# Patient Record
Sex: Female | Born: 1945 | Race: White | Hispanic: No | Smoking: Former smoker
Health system: Southern US, Community
[De-identification: ages and names within clinical notes are randomized; demographics above are authoritative.]

## PROBLEM LIST (undated history)

## (undated) DIAGNOSIS — I499 Cardiac arrhythmia, unspecified: Secondary | ICD-10-CM

## (undated) DIAGNOSIS — K219 Gastro-esophageal reflux disease without esophagitis: Secondary | ICD-10-CM

## (undated) DIAGNOSIS — M6530 Trigger finger, unspecified finger: Secondary | ICD-10-CM

## (undated) DIAGNOSIS — H409 Unspecified glaucoma: Secondary | ICD-10-CM

## (undated) DIAGNOSIS — M810 Age-related osteoporosis without current pathological fracture: Secondary | ICD-10-CM

## (undated) DIAGNOSIS — K2270 Barrett's esophagus without dysplasia: Secondary | ICD-10-CM

## (undated) DIAGNOSIS — K429 Umbilical hernia without obstruction or gangrene: Secondary | ICD-10-CM

## (undated) DIAGNOSIS — K08109 Complete loss of teeth, unspecified cause, unspecified class: Secondary | ICD-10-CM

## (undated) DIAGNOSIS — E785 Hyperlipidemia, unspecified: Secondary | ICD-10-CM

## (undated) DIAGNOSIS — G629 Polyneuropathy, unspecified: Secondary | ICD-10-CM

## (undated) DIAGNOSIS — Z8719 Personal history of other diseases of the digestive system: Secondary | ICD-10-CM

## (undated) DIAGNOSIS — Z9889 Other specified postprocedural states: Secondary | ICD-10-CM

## (undated) DIAGNOSIS — R569 Unspecified convulsions: Secondary | ICD-10-CM

## (undated) DIAGNOSIS — R112 Nausea with vomiting, unspecified: Secondary | ICD-10-CM

## (undated) DIAGNOSIS — D1800 Hemangioma unspecified site: Secondary | ICD-10-CM

## (undated) DIAGNOSIS — G40909 Epilepsy, unspecified, not intractable, without status epilepticus: Secondary | ICD-10-CM

## (undated) DIAGNOSIS — K5790 Diverticulosis of intestine, part unspecified, without perforation or abscess without bleeding: Secondary | ICD-10-CM

## (undated) DIAGNOSIS — R002 Palpitations: Secondary | ICD-10-CM

## (undated) DIAGNOSIS — G43909 Migraine, unspecified, not intractable, without status migrainosus: Secondary | ICD-10-CM

## (undated) DIAGNOSIS — M1990 Unspecified osteoarthritis, unspecified site: Secondary | ICD-10-CM

## (undated) DIAGNOSIS — K760 Fatty (change of) liver, not elsewhere classified: Secondary | ICD-10-CM

## (undated) DIAGNOSIS — C801 Malignant (primary) neoplasm, unspecified: Secondary | ICD-10-CM

## (undated) DIAGNOSIS — I10 Essential (primary) hypertension: Secondary | ICD-10-CM

## (undated) HISTORY — PX: PILONIDAL CYST / SINUS EXCISION: SUR543

## (undated) HISTORY — PX: KNEE ARTHROSCOPY: SUR90

## (undated) HISTORY — PX: OTHER SURGICAL HISTORY: SHX169

## (undated) HISTORY — PX: CARPAL TUNNEL RELEASE: SHX101

## (undated) HISTORY — PX: DILATION AND CURETTAGE OF UTERUS: SHX78

## (undated) HISTORY — PX: HEMORRHOID SURGERY: SHX153

## (undated) HISTORY — PX: COLONOSCOPY: SHX174

## (undated) HISTORY — PX: TENDON REPAIR: SHX5111

## (undated) HISTORY — PX: TONSILLECTOMY: SUR1361

## (undated) HISTORY — PX: ABDOMINAL HYSTERECTOMY: SHX81

---

## 1985-04-23 HISTORY — PX: BREAST BIOPSY: SHX20

## 1985-04-23 HISTORY — PX: BREAST EXCISIONAL BIOPSY: SUR124

## 1990-04-23 DIAGNOSIS — C801 Malignant (primary) neoplasm, unspecified: Secondary | ICD-10-CM

## 1990-04-23 HISTORY — DX: Malignant (primary) neoplasm, unspecified: C80.1

## 2004-02-22 ENCOUNTER — Ambulatory Visit: Payer: Self-pay | Admitting: Psychiatry

## 2004-12-14 ENCOUNTER — Ambulatory Visit: Payer: Self-pay | Admitting: Family Medicine

## 2005-04-20 ENCOUNTER — Other Ambulatory Visit: Payer: Self-pay

## 2005-04-20 ENCOUNTER — Emergency Department: Payer: Self-pay | Admitting: General Practice

## 2006-01-22 ENCOUNTER — Ambulatory Visit: Payer: Self-pay | Admitting: Family Medicine

## 2006-12-08 ENCOUNTER — Emergency Department: Payer: Self-pay | Admitting: Emergency Medicine

## 2007-02-24 ENCOUNTER — Ambulatory Visit: Payer: Self-pay | Admitting: Family Medicine

## 2008-02-08 ENCOUNTER — Inpatient Hospital Stay: Payer: Self-pay | Admitting: Internal Medicine

## 2008-04-07 ENCOUNTER — Ambulatory Visit: Payer: Self-pay | Admitting: Family Medicine

## 2009-04-12 ENCOUNTER — Ambulatory Visit: Payer: Self-pay | Admitting: Family Medicine

## 2009-09-09 ENCOUNTER — Ambulatory Visit: Payer: Self-pay | Admitting: Neurology

## 2010-04-13 ENCOUNTER — Ambulatory Visit: Payer: Self-pay | Admitting: Family Medicine

## 2011-04-19 ENCOUNTER — Ambulatory Visit: Payer: Self-pay | Admitting: Family Medicine

## 2012-04-29 ENCOUNTER — Ambulatory Visit: Payer: Self-pay | Admitting: Family Medicine

## 2012-05-09 ENCOUNTER — Encounter: Payer: Self-pay | Admitting: Rheumatology

## 2012-05-24 ENCOUNTER — Encounter: Payer: Self-pay | Admitting: Rheumatology

## 2013-02-27 ENCOUNTER — Emergency Department: Payer: Self-pay | Admitting: Emergency Medicine

## 2013-02-27 LAB — CBC
HGB: 14.7 g/dL (ref 12.0–16.0)
MCH: 31.7 pg (ref 26.0–34.0)
Platelet: 249 10*3/uL (ref 150–440)
RBC: 4.63 10*6/uL (ref 3.80–5.20)
RDW: 12.8 % (ref 11.5–14.5)

## 2013-02-27 LAB — HEPATIC FUNCTION PANEL A (ARMC)
Bilirubin, Direct: <0.1 mg/dL (ref 0.00–0.20)
Bilirubin,Total: 0.3 mg/dL (ref 0.2–1.0)
SGOT(AST): 28 U/L (ref 15–37)
SGPT (ALT): 21 U/L (ref 12–78)
Total Protein: 6.8 g/dL (ref 6.4–8.2)

## 2013-02-27 LAB — LIPASE, BLOOD: Lipase: 180 U/L (ref 73–393)

## 2013-02-27 LAB — BASIC METABOLIC PANEL
Anion Gap: 2 — ABNORMAL LOW (ref 7–16)
BUN: 12 mg/dL (ref 7–18)
Calcium, Total: 9.5 mg/dL (ref 8.5–10.1)
Chloride: 107 mmol/L (ref 98–107)
EGFR (African American): >60
Osmolality: 280 (ref 275–301)
Sodium: 140 mmol/L (ref 136–145)

## 2013-02-27 LAB — TROPONIN I: Troponin-I: <0.02 ng/mL

## 2013-05-05 ENCOUNTER — Ambulatory Visit: Payer: Self-pay | Admitting: Family Medicine

## 2013-05-26 ENCOUNTER — Ambulatory Visit: Payer: Self-pay | Admitting: Gastroenterology

## 2014-05-06 ENCOUNTER — Ambulatory Visit: Payer: Self-pay | Admitting: Family Medicine

## 2014-05-06 DIAGNOSIS — Z1231 Encounter for screening mammogram for malignant neoplasm of breast: Secondary | ICD-10-CM | POA: Diagnosis not present

## 2014-05-19 DIAGNOSIS — M25562 Pain in left knee: Secondary | ICD-10-CM | POA: Diagnosis not present

## 2014-05-19 DIAGNOSIS — Z0000 Encounter for general adult medical examination without abnormal findings: Secondary | ICD-10-CM | POA: Diagnosis not present

## 2014-05-19 DIAGNOSIS — Z8739 Personal history of other diseases of the musculoskeletal system and connective tissue: Secondary | ICD-10-CM | POA: Diagnosis not present

## 2014-06-08 DIAGNOSIS — M25511 Pain in right shoulder: Secondary | ICD-10-CM | POA: Diagnosis not present

## 2014-06-08 DIAGNOSIS — M1712 Unilateral primary osteoarthritis, left knee: Secondary | ICD-10-CM | POA: Diagnosis not present

## 2014-06-17 DIAGNOSIS — H2513 Age-related nuclear cataract, bilateral: Secondary | ICD-10-CM | POA: Diagnosis not present

## 2014-06-21 ENCOUNTER — Ambulatory Visit: Payer: Self-pay | Admitting: Unknown Physician Specialty

## 2014-06-21 DIAGNOSIS — M19011 Primary osteoarthritis, right shoulder: Secondary | ICD-10-CM | POA: Diagnosis not present

## 2014-06-21 DIAGNOSIS — M7581 Other shoulder lesions, right shoulder: Secondary | ICD-10-CM | POA: Diagnosis not present

## 2014-06-21 DIAGNOSIS — S838X2A Sprain of other specified parts of left knee, initial encounter: Secondary | ICD-10-CM | POA: Diagnosis not present

## 2014-06-21 DIAGNOSIS — S83242A Other tear of medial meniscus, current injury, left knee, initial encounter: Secondary | ICD-10-CM | POA: Diagnosis not present

## 2014-06-21 DIAGNOSIS — M1712 Unilateral primary osteoarthritis, left knee: Secondary | ICD-10-CM | POA: Diagnosis not present

## 2014-07-06 DIAGNOSIS — M1711 Unilateral primary osteoarthritis, right knee: Secondary | ICD-10-CM | POA: Insufficient documentation

## 2014-07-06 DIAGNOSIS — M7541 Impingement syndrome of right shoulder: Secondary | ICD-10-CM | POA: Diagnosis not present

## 2014-07-06 DIAGNOSIS — S83242A Other tear of medial meniscus, current injury, left knee, initial encounter: Secondary | ICD-10-CM | POA: Diagnosis not present

## 2014-07-06 DIAGNOSIS — M1712 Unilateral primary osteoarthritis, left knee: Secondary | ICD-10-CM | POA: Diagnosis not present

## 2014-07-19 DIAGNOSIS — K006 Disturbances in tooth eruption: Secondary | ICD-10-CM | POA: Diagnosis not present

## 2014-07-26 DIAGNOSIS — R69 Illness, unspecified: Secondary | ICD-10-CM | POA: Diagnosis not present

## 2014-08-03 DIAGNOSIS — S83209A Unspecified tear of unspecified meniscus, current injury, unspecified knee, initial encounter: Secondary | ICD-10-CM | POA: Insufficient documentation

## 2014-08-03 DIAGNOSIS — M7541 Impingement syndrome of right shoulder: Secondary | ICD-10-CM | POA: Diagnosis not present

## 2014-08-03 DIAGNOSIS — M1712 Unilateral primary osteoarthritis, left knee: Secondary | ICD-10-CM | POA: Diagnosis not present

## 2014-08-03 DIAGNOSIS — M23304 Other meniscus derangements, unspecified medial meniscus, left knee: Secondary | ICD-10-CM | POA: Diagnosis not present

## 2014-08-11 ENCOUNTER — Ambulatory Visit
Admit: 2014-08-11 | Disposition: A | Discharge status: Home / Self Care | Payer: Self-pay | Attending: Anesthesiology | Admitting: Anesthesiology

## 2014-08-11 DIAGNOSIS — I10 Essential (primary) hypertension: Secondary | ICD-10-CM | POA: Diagnosis not present

## 2014-08-11 DIAGNOSIS — I498 Other specified cardiac arrhythmias: Secondary | ICD-10-CM | POA: Diagnosis not present

## 2014-08-13 ENCOUNTER — Ambulatory Visit
Admit: 2014-08-13 | Disposition: A | Discharge status: Home / Self Care | Payer: Self-pay | Attending: Unknown Physician Specialty | Admitting: Unknown Physician Specialty

## 2014-08-13 DIAGNOSIS — S83242A Other tear of medial meniscus, current injury, left knee, initial encounter: Secondary | ICD-10-CM | POA: Diagnosis not present

## 2014-08-13 DIAGNOSIS — I10 Essential (primary) hypertension: Secondary | ICD-10-CM | POA: Diagnosis not present

## 2014-08-13 DIAGNOSIS — R05 Cough: Secondary | ICD-10-CM | POA: Diagnosis not present

## 2014-08-13 DIAGNOSIS — M2241 Chondromalacia patellae, right knee: Secondary | ICD-10-CM | POA: Diagnosis not present

## 2014-08-13 DIAGNOSIS — Z87891 Personal history of nicotine dependence: Secondary | ICD-10-CM | POA: Diagnosis not present

## 2014-08-13 DIAGNOSIS — M19011 Primary osteoarthritis, right shoulder: Secondary | ICD-10-CM | POA: Diagnosis not present

## 2014-08-13 DIAGNOSIS — G43909 Migraine, unspecified, not intractable, without status migrainosus: Secondary | ICD-10-CM | POA: Diagnosis not present

## 2014-08-13 DIAGNOSIS — M19041 Primary osteoarthritis, right hand: Secondary | ICD-10-CM | POA: Diagnosis not present

## 2014-08-13 DIAGNOSIS — M19042 Primary osteoarthritis, left hand: Secondary | ICD-10-CM | POA: Diagnosis not present

## 2014-08-13 DIAGNOSIS — M19012 Primary osteoarthritis, left shoulder: Secondary | ICD-10-CM | POA: Diagnosis not present

## 2014-08-13 DIAGNOSIS — M23222 Derangement of posterior horn of medial meniscus due to old tear or injury, left knee: Secondary | ICD-10-CM | POA: Diagnosis not present

## 2014-09-01 DIAGNOSIS — M7541 Impingement syndrome of right shoulder: Secondary | ICD-10-CM | POA: Diagnosis not present

## 2014-09-02 DIAGNOSIS — J40 Bronchitis, not specified as acute or chronic: Secondary | ICD-10-CM | POA: Diagnosis not present

## 2014-09-08 DIAGNOSIS — M7541 Impingement syndrome of right shoulder: Secondary | ICD-10-CM | POA: Diagnosis not present

## 2014-09-10 DIAGNOSIS — M7541 Impingement syndrome of right shoulder: Secondary | ICD-10-CM | POA: Diagnosis not present

## 2014-09-13 DIAGNOSIS — D2262 Melanocytic nevi of left upper limb, including shoulder: Secondary | ICD-10-CM | POA: Diagnosis not present

## 2014-09-13 DIAGNOSIS — D2271 Melanocytic nevi of right lower limb, including hip: Secondary | ICD-10-CM | POA: Diagnosis not present

## 2014-09-13 DIAGNOSIS — D225 Melanocytic nevi of trunk: Secondary | ICD-10-CM | POA: Diagnosis not present

## 2014-09-13 DIAGNOSIS — L408 Other psoriasis: Secondary | ICD-10-CM | POA: Diagnosis not present

## 2014-09-14 DIAGNOSIS — M7541 Impingement syndrome of right shoulder: Secondary | ICD-10-CM | POA: Diagnosis not present

## 2014-09-22 DIAGNOSIS — M7541 Impingement syndrome of right shoulder: Secondary | ICD-10-CM | POA: Diagnosis not present

## 2014-10-16 DIAGNOSIS — R69 Illness, unspecified: Secondary | ICD-10-CM | POA: Diagnosis not present

## 2014-11-15 DIAGNOSIS — J069 Acute upper respiratory infection, unspecified: Secondary | ICD-10-CM | POA: Diagnosis not present

## 2014-11-15 DIAGNOSIS — G609 Hereditary and idiopathic neuropathy, unspecified: Secondary | ICD-10-CM | POA: Diagnosis not present

## 2014-11-22 DIAGNOSIS — G609 Hereditary and idiopathic neuropathy, unspecified: Secondary | ICD-10-CM | POA: Diagnosis not present

## 2014-12-21 DIAGNOSIS — G43719 Chronic migraine without aura, intractable, without status migrainosus: Secondary | ICD-10-CM | POA: Diagnosis not present

## 2014-12-21 DIAGNOSIS — E559 Vitamin D deficiency, unspecified: Secondary | ICD-10-CM | POA: Diagnosis not present

## 2014-12-21 DIAGNOSIS — E538 Deficiency of other specified B group vitamins: Secondary | ICD-10-CM | POA: Diagnosis not present

## 2014-12-21 DIAGNOSIS — G609 Hereditary and idiopathic neuropathy, unspecified: Secondary | ICD-10-CM | POA: Diagnosis not present

## 2014-12-23 DIAGNOSIS — G609 Hereditary and idiopathic neuropathy, unspecified: Secondary | ICD-10-CM | POA: Diagnosis not present

## 2015-01-07 DIAGNOSIS — R05 Cough: Secondary | ICD-10-CM | POA: Diagnosis not present

## 2015-01-07 DIAGNOSIS — J208 Acute bronchitis due to other specified organisms: Secondary | ICD-10-CM | POA: Diagnosis not present

## 2015-01-07 DIAGNOSIS — B9689 Other specified bacterial agents as the cause of diseases classified elsewhere: Secondary | ICD-10-CM | POA: Diagnosis not present

## 2015-01-10 DIAGNOSIS — R05 Cough: Secondary | ICD-10-CM | POA: Diagnosis not present

## 2015-01-12 DIAGNOSIS — R05 Cough: Secondary | ICD-10-CM | POA: Diagnosis not present

## 2015-02-01 DIAGNOSIS — J309 Allergic rhinitis, unspecified: Secondary | ICD-10-CM | POA: Diagnosis not present

## 2015-02-01 DIAGNOSIS — A084 Viral intestinal infection, unspecified: Secondary | ICD-10-CM | POA: Diagnosis not present

## 2015-03-04 DIAGNOSIS — G43719 Chronic migraine without aura, intractable, without status migrainosus: Secondary | ICD-10-CM | POA: Diagnosis not present

## 2015-03-04 DIAGNOSIS — G609 Hereditary and idiopathic neuropathy, unspecified: Secondary | ICD-10-CM | POA: Diagnosis not present

## 2015-03-12 DIAGNOSIS — L739 Follicular disorder, unspecified: Secondary | ICD-10-CM | POA: Diagnosis not present

## 2015-03-12 DIAGNOSIS — L0291 Cutaneous abscess, unspecified: Secondary | ICD-10-CM | POA: Diagnosis not present

## 2015-04-22 DIAGNOSIS — B356 Tinea cruris: Secondary | ICD-10-CM | POA: Diagnosis not present

## 2015-04-22 DIAGNOSIS — L0291 Cutaneous abscess, unspecified: Secondary | ICD-10-CM | POA: Diagnosis not present

## 2015-05-23 DIAGNOSIS — E785 Hyperlipidemia, unspecified: Secondary | ICD-10-CM | POA: Diagnosis not present

## 2015-05-23 DIAGNOSIS — I10 Essential (primary) hypertension: Secondary | ICD-10-CM | POA: Diagnosis not present

## 2015-05-24 ENCOUNTER — Other Ambulatory Visit: Payer: Self-pay | Admitting: Family Medicine

## 2015-05-24 DIAGNOSIS — Z1231 Encounter for screening mammogram for malignant neoplasm of breast: Secondary | ICD-10-CM

## 2015-05-24 DIAGNOSIS — E785 Hyperlipidemia, unspecified: Secondary | ICD-10-CM | POA: Diagnosis not present

## 2015-05-24 DIAGNOSIS — Z7289 Other problems related to lifestyle: Secondary | ICD-10-CM | POA: Diagnosis not present

## 2015-05-24 DIAGNOSIS — Z1159 Encounter for screening for other viral diseases: Secondary | ICD-10-CM | POA: Diagnosis not present

## 2015-05-24 DIAGNOSIS — I10 Essential (primary) hypertension: Secondary | ICD-10-CM | POA: Diagnosis not present

## 2015-06-02 ENCOUNTER — Ambulatory Visit
Admission: RE | Admit: 2015-06-02 | Discharge: 2015-06-02 | Disposition: A | Discharge status: Home / Self Care | Payer: Commercial Managed Care - HMO | Source: Ambulatory Visit | Attending: Family Medicine | Admitting: Family Medicine

## 2015-06-02 ENCOUNTER — Other Ambulatory Visit: Payer: Self-pay | Admitting: Family Medicine

## 2015-06-02 DIAGNOSIS — Z1231 Encounter for screening mammogram for malignant neoplasm of breast: Secondary | ICD-10-CM

## 2015-06-02 HISTORY — DX: Malignant (primary) neoplasm, unspecified: C80.1

## 2015-08-10 DIAGNOSIS — H35372 Puckering of macula, left eye: Secondary | ICD-10-CM | POA: Diagnosis not present

## 2015-12-06 DIAGNOSIS — H8110 Benign paroxysmal vertigo, unspecified ear: Secondary | ICD-10-CM | POA: Diagnosis not present

## 2016-01-25 DIAGNOSIS — M19011 Primary osteoarthritis, right shoulder: Secondary | ICD-10-CM | POA: Diagnosis not present

## 2016-01-25 DIAGNOSIS — N764 Abscess of vulva: Secondary | ICD-10-CM | POA: Diagnosis not present

## 2016-01-25 DIAGNOSIS — M7551 Bursitis of right shoulder: Secondary | ICD-10-CM | POA: Diagnosis not present

## 2016-03-14 DIAGNOSIS — R220 Localized swelling, mass and lump, head: Secondary | ICD-10-CM | POA: Diagnosis not present

## 2016-04-10 DIAGNOSIS — B9561 Methicillin susceptible Staphylococcus aureus infection as the cause of diseases classified elsewhere: Secondary | ICD-10-CM | POA: Diagnosis not present

## 2016-04-10 DIAGNOSIS — L739 Follicular disorder, unspecified: Secondary | ICD-10-CM | POA: Diagnosis not present

## 2016-04-25 ENCOUNTER — Other Ambulatory Visit: Payer: Self-pay | Admitting: Family Medicine

## 2016-04-25 DIAGNOSIS — Z1231 Encounter for screening mammogram for malignant neoplasm of breast: Secondary | ICD-10-CM

## 2016-06-05 ENCOUNTER — Ambulatory Visit
Admission: RE | Admit: 2016-06-05 | Discharge: 2016-06-05 | Disposition: A | Discharge status: Home / Self Care | Payer: Commercial Managed Care - HMO | Source: Ambulatory Visit | Attending: Family Medicine | Admitting: Family Medicine

## 2016-06-05 DIAGNOSIS — Z1231 Encounter for screening mammogram for malignant neoplasm of breast: Secondary | ICD-10-CM | POA: Insufficient documentation

## 2016-06-14 DIAGNOSIS — E785 Hyperlipidemia, unspecified: Secondary | ICD-10-CM | POA: Diagnosis not present

## 2016-06-14 DIAGNOSIS — Z0000 Encounter for general adult medical examination without abnormal findings: Secondary | ICD-10-CM | POA: Diagnosis not present

## 2016-06-14 DIAGNOSIS — Z8639 Personal history of other endocrine, nutritional and metabolic disease: Secondary | ICD-10-CM | POA: Diagnosis not present

## 2016-06-21 DIAGNOSIS — L03311 Cellulitis of abdominal wall: Secondary | ICD-10-CM | POA: Diagnosis not present

## 2016-06-21 DIAGNOSIS — I10 Essential (primary) hypertension: Secondary | ICD-10-CM | POA: Diagnosis not present

## 2016-06-21 DIAGNOSIS — M25511 Pain in right shoulder: Secondary | ICD-10-CM | POA: Diagnosis not present

## 2016-06-21 DIAGNOSIS — Z0000 Encounter for general adult medical examination without abnormal findings: Secondary | ICD-10-CM | POA: Diagnosis not present

## 2016-06-27 DIAGNOSIS — L304 Erythema intertrigo: Secondary | ICD-10-CM | POA: Diagnosis not present

## 2016-06-27 DIAGNOSIS — L0390 Cellulitis, unspecified: Secondary | ICD-10-CM | POA: Diagnosis not present

## 2016-07-04 DIAGNOSIS — L0292 Furuncle, unspecified: Secondary | ICD-10-CM | POA: Diagnosis not present

## 2016-07-04 DIAGNOSIS — L304 Erythema intertrigo: Secondary | ICD-10-CM | POA: Diagnosis not present

## 2016-07-04 DIAGNOSIS — I781 Nevus, non-neoplastic: Secondary | ICD-10-CM | POA: Diagnosis not present

## 2016-07-04 DIAGNOSIS — L821 Other seborrheic keratosis: Secondary | ICD-10-CM | POA: Diagnosis not present

## 2016-08-15 ENCOUNTER — Other Ambulatory Visit: Payer: Self-pay | Admitting: Unknown Physician Specialty

## 2016-08-15 DIAGNOSIS — M25511 Pain in right shoulder: Principal | ICD-10-CM

## 2016-08-15 DIAGNOSIS — G8929 Other chronic pain: Secondary | ICD-10-CM

## 2016-08-29 ENCOUNTER — Ambulatory Visit
Admission: RE | Admit: 2016-08-29 | Discharge: 2016-08-29 | Disposition: A | Discharge status: Home / Self Care | Payer: Medicare HMO | Source: Ambulatory Visit | Attending: Unknown Physician Specialty | Admitting: Unknown Physician Specialty

## 2016-08-29 DIAGNOSIS — M25511 Pain in right shoulder: Secondary | ICD-10-CM | POA: Diagnosis not present

## 2016-08-29 DIAGNOSIS — M25711 Osteophyte, right shoulder: Secondary | ICD-10-CM | POA: Insufficient documentation

## 2016-08-29 DIAGNOSIS — M19011 Primary osteoarthritis, right shoulder: Secondary | ICD-10-CM | POA: Insufficient documentation

## 2016-08-29 DIAGNOSIS — G8929 Other chronic pain: Secondary | ICD-10-CM

## 2016-09-03 DIAGNOSIS — G8929 Other chronic pain: Secondary | ICD-10-CM | POA: Diagnosis not present

## 2016-09-03 DIAGNOSIS — M7541 Impingement syndrome of right shoulder: Secondary | ICD-10-CM | POA: Diagnosis not present

## 2016-09-03 DIAGNOSIS — M19011 Primary osteoarthritis, right shoulder: Secondary | ICD-10-CM | POA: Insufficient documentation

## 2016-09-03 DIAGNOSIS — M542 Cervicalgia: Secondary | ICD-10-CM | POA: Insufficient documentation

## 2016-09-03 DIAGNOSIS — M25511 Pain in right shoulder: Secondary | ICD-10-CM | POA: Diagnosis not present

## 2016-09-22 DIAGNOSIS — L03311 Cellulitis of abdominal wall: Secondary | ICD-10-CM | POA: Diagnosis not present

## 2017-01-07 DIAGNOSIS — L989 Disorder of the skin and subcutaneous tissue, unspecified: Secondary | ICD-10-CM | POA: Diagnosis not present

## 2017-01-07 DIAGNOSIS — M722 Plantar fascial fibromatosis: Secondary | ICD-10-CM | POA: Diagnosis not present

## 2017-01-09 DIAGNOSIS — L821 Other seborrheic keratosis: Secondary | ICD-10-CM | POA: Diagnosis not present

## 2017-01-09 DIAGNOSIS — R238 Other skin changes: Secondary | ICD-10-CM | POA: Diagnosis not present

## 2017-01-09 DIAGNOSIS — L820 Inflamed seborrheic keratosis: Secondary | ICD-10-CM | POA: Diagnosis not present

## 2017-01-21 DIAGNOSIS — M722 Plantar fascial fibromatosis: Secondary | ICD-10-CM | POA: Diagnosis not present

## 2017-01-21 DIAGNOSIS — M79672 Pain in left foot: Secondary | ICD-10-CM | POA: Diagnosis not present

## 2017-02-11 DIAGNOSIS — M722 Plantar fascial fibromatosis: Secondary | ICD-10-CM | POA: Diagnosis not present

## 2017-05-06 ENCOUNTER — Other Ambulatory Visit: Payer: Self-pay | Admitting: Family Medicine

## 2017-05-06 DIAGNOSIS — Z1231 Encounter for screening mammogram for malignant neoplasm of breast: Secondary | ICD-10-CM

## 2017-06-06 ENCOUNTER — Ambulatory Visit
Admission: RE | Admit: 2017-06-06 | Discharge: 2017-06-06 | Disposition: A | Discharge status: Home / Self Care | Payer: Commercial Managed Care - HMO | Source: Ambulatory Visit | Attending: Family Medicine | Admitting: Family Medicine

## 2017-06-06 DIAGNOSIS — Z1231 Encounter for screening mammogram for malignant neoplasm of breast: Secondary | ICD-10-CM | POA: Insufficient documentation

## 2017-06-26 DIAGNOSIS — I10 Essential (primary) hypertension: Secondary | ICD-10-CM | POA: Diagnosis not present

## 2017-07-01 DIAGNOSIS — R450 Nervousness: Secondary | ICD-10-CM | POA: Diagnosis not present

## 2017-07-01 DIAGNOSIS — I10 Essential (primary) hypertension: Secondary | ICD-10-CM | POA: Diagnosis not present

## 2017-07-01 DIAGNOSIS — Z0000 Encounter for general adult medical examination without abnormal findings: Secondary | ICD-10-CM | POA: Diagnosis not present

## 2017-07-01 DIAGNOSIS — Z23 Encounter for immunization: Secondary | ICD-10-CM | POA: Diagnosis not present

## 2017-07-01 DIAGNOSIS — R0789 Other chest pain: Secondary | ICD-10-CM | POA: Diagnosis not present

## 2017-07-10 DIAGNOSIS — E785 Hyperlipidemia, unspecified: Secondary | ICD-10-CM | POA: Diagnosis not present

## 2017-07-10 DIAGNOSIS — R002 Palpitations: Secondary | ICD-10-CM | POA: Insufficient documentation

## 2017-07-10 DIAGNOSIS — R0602 Shortness of breath: Secondary | ICD-10-CM | POA: Insufficient documentation

## 2017-07-10 DIAGNOSIS — R079 Chest pain, unspecified: Secondary | ICD-10-CM | POA: Insufficient documentation

## 2017-07-10 DIAGNOSIS — I10 Essential (primary) hypertension: Secondary | ICD-10-CM | POA: Diagnosis not present

## 2017-07-25 DIAGNOSIS — S60351A Superficial foreign body of right thumb, initial encounter: Secondary | ICD-10-CM | POA: Diagnosis not present

## 2017-07-25 DIAGNOSIS — Z1833 Retained wood fragments: Secondary | ICD-10-CM | POA: Diagnosis not present

## 2017-08-06 DIAGNOSIS — R079 Chest pain, unspecified: Secondary | ICD-10-CM | POA: Diagnosis not present

## 2017-08-16 DIAGNOSIS — E785 Hyperlipidemia, unspecified: Secondary | ICD-10-CM | POA: Diagnosis not present

## 2017-08-16 DIAGNOSIS — R0602 Shortness of breath: Secondary | ICD-10-CM | POA: Diagnosis not present

## 2017-08-16 DIAGNOSIS — R079 Chest pain, unspecified: Secondary | ICD-10-CM | POA: Diagnosis not present

## 2017-08-16 DIAGNOSIS — R002 Palpitations: Secondary | ICD-10-CM | POA: Diagnosis not present

## 2017-08-16 DIAGNOSIS — I10 Essential (primary) hypertension: Secondary | ICD-10-CM | POA: Diagnosis not present

## 2017-10-15 DIAGNOSIS — R202 Paresthesia of skin: Secondary | ICD-10-CM | POA: Diagnosis not present

## 2017-10-15 DIAGNOSIS — R413 Other amnesia: Secondary | ICD-10-CM | POA: Diagnosis not present

## 2017-12-19 DIAGNOSIS — E519 Thiamine deficiency, unspecified: Secondary | ICD-10-CM | POA: Diagnosis not present

## 2017-12-19 DIAGNOSIS — E538 Deficiency of other specified B group vitamins: Secondary | ICD-10-CM | POA: Diagnosis not present

## 2017-12-19 DIAGNOSIS — E559 Vitamin D deficiency, unspecified: Secondary | ICD-10-CM | POA: Diagnosis not present

## 2017-12-19 DIAGNOSIS — R4189 Other symptoms and signs involving cognitive functions and awareness: Secondary | ICD-10-CM | POA: Diagnosis not present

## 2017-12-19 DIAGNOSIS — G629 Polyneuropathy, unspecified: Secondary | ICD-10-CM | POA: Diagnosis not present

## 2017-12-19 DIAGNOSIS — G43019 Migraine without aura, intractable, without status migrainosus: Secondary | ICD-10-CM | POA: Diagnosis not present

## 2018-01-17 DIAGNOSIS — G8929 Other chronic pain: Secondary | ICD-10-CM | POA: Diagnosis not present

## 2018-01-17 DIAGNOSIS — M545 Low back pain: Secondary | ICD-10-CM | POA: Diagnosis not present

## 2018-01-17 DIAGNOSIS — M47816 Spondylosis without myelopathy or radiculopathy, lumbar region: Secondary | ICD-10-CM | POA: Diagnosis not present

## 2018-01-31 DIAGNOSIS — G8929 Other chronic pain: Secondary | ICD-10-CM | POA: Diagnosis not present

## 2018-01-31 DIAGNOSIS — M545 Low back pain: Secondary | ICD-10-CM | POA: Diagnosis not present

## 2018-02-05 DIAGNOSIS — M5416 Radiculopathy, lumbar region: Secondary | ICD-10-CM | POA: Diagnosis not present

## 2018-02-05 DIAGNOSIS — M5136 Other intervertebral disc degeneration, lumbar region: Secondary | ICD-10-CM | POA: Diagnosis not present

## 2018-02-05 DIAGNOSIS — M47816 Spondylosis without myelopathy or radiculopathy, lumbar region: Secondary | ICD-10-CM | POA: Diagnosis not present

## 2018-02-14 DIAGNOSIS — M6281 Muscle weakness (generalized): Secondary | ICD-10-CM | POA: Diagnosis not present

## 2018-02-14 DIAGNOSIS — M545 Low back pain: Secondary | ICD-10-CM | POA: Diagnosis not present

## 2018-02-14 DIAGNOSIS — R262 Difficulty in walking, not elsewhere classified: Secondary | ICD-10-CM | POA: Diagnosis not present

## 2018-02-17 DIAGNOSIS — M6281 Muscle weakness (generalized): Secondary | ICD-10-CM | POA: Diagnosis not present

## 2018-02-17 DIAGNOSIS — R262 Difficulty in walking, not elsewhere classified: Secondary | ICD-10-CM | POA: Diagnosis not present

## 2018-02-17 DIAGNOSIS — M545 Low back pain: Secondary | ICD-10-CM | POA: Diagnosis not present

## 2018-02-19 DIAGNOSIS — M545 Low back pain: Secondary | ICD-10-CM | POA: Diagnosis not present

## 2018-02-19 DIAGNOSIS — R262 Difficulty in walking, not elsewhere classified: Secondary | ICD-10-CM | POA: Diagnosis not present

## 2018-02-19 DIAGNOSIS — M6281 Muscle weakness (generalized): Secondary | ICD-10-CM | POA: Diagnosis not present

## 2018-02-25 DIAGNOSIS — M6281 Muscle weakness (generalized): Secondary | ICD-10-CM | POA: Diagnosis not present

## 2018-02-25 DIAGNOSIS — M545 Low back pain: Secondary | ICD-10-CM | POA: Diagnosis not present

## 2018-02-25 DIAGNOSIS — R262 Difficulty in walking, not elsewhere classified: Secondary | ICD-10-CM | POA: Diagnosis not present

## 2018-02-27 DIAGNOSIS — M545 Low back pain: Secondary | ICD-10-CM | POA: Diagnosis not present

## 2018-02-27 DIAGNOSIS — M6281 Muscle weakness (generalized): Secondary | ICD-10-CM | POA: Diagnosis not present

## 2018-02-27 DIAGNOSIS — R262 Difficulty in walking, not elsewhere classified: Secondary | ICD-10-CM | POA: Diagnosis not present

## 2018-03-05 DIAGNOSIS — M545 Low back pain: Secondary | ICD-10-CM | POA: Diagnosis not present

## 2018-03-05 DIAGNOSIS — R262 Difficulty in walking, not elsewhere classified: Secondary | ICD-10-CM | POA: Diagnosis not present

## 2018-03-05 DIAGNOSIS — M6281 Muscle weakness (generalized): Secondary | ICD-10-CM | POA: Diagnosis not present

## 2018-03-07 DIAGNOSIS — R262 Difficulty in walking, not elsewhere classified: Secondary | ICD-10-CM | POA: Diagnosis not present

## 2018-03-07 DIAGNOSIS — M545 Low back pain: Secondary | ICD-10-CM | POA: Diagnosis not present

## 2018-03-07 DIAGNOSIS — M6281 Muscle weakness (generalized): Secondary | ICD-10-CM | POA: Diagnosis not present

## 2018-03-17 DIAGNOSIS — M545 Low back pain: Secondary | ICD-10-CM | POA: Diagnosis not present

## 2018-03-17 DIAGNOSIS — R262 Difficulty in walking, not elsewhere classified: Secondary | ICD-10-CM | POA: Diagnosis not present

## 2018-03-17 DIAGNOSIS — H2511 Age-related nuclear cataract, right eye: Secondary | ICD-10-CM | POA: Diagnosis not present

## 2018-03-17 DIAGNOSIS — M6281 Muscle weakness (generalized): Secondary | ICD-10-CM | POA: Diagnosis not present

## 2018-03-19 DIAGNOSIS — R262 Difficulty in walking, not elsewhere classified: Secondary | ICD-10-CM | POA: Diagnosis not present

## 2018-03-19 DIAGNOSIS — M545 Low back pain: Secondary | ICD-10-CM | POA: Diagnosis not present

## 2018-03-19 DIAGNOSIS — M6281 Muscle weakness (generalized): Secondary | ICD-10-CM | POA: Diagnosis not present

## 2018-03-24 DIAGNOSIS — R262 Difficulty in walking, not elsewhere classified: Secondary | ICD-10-CM | POA: Diagnosis not present

## 2018-03-24 DIAGNOSIS — M6281 Muscle weakness (generalized): Secondary | ICD-10-CM | POA: Diagnosis not present

## 2018-03-24 DIAGNOSIS — M545 Low back pain: Secondary | ICD-10-CM | POA: Diagnosis not present

## 2018-03-26 DIAGNOSIS — M6281 Muscle weakness (generalized): Secondary | ICD-10-CM | POA: Diagnosis not present

## 2018-03-26 DIAGNOSIS — M545 Low back pain: Secondary | ICD-10-CM | POA: Diagnosis not present

## 2018-03-26 DIAGNOSIS — R262 Difficulty in walking, not elsewhere classified: Secondary | ICD-10-CM | POA: Diagnosis not present

## 2018-03-31 DIAGNOSIS — M6281 Muscle weakness (generalized): Secondary | ICD-10-CM | POA: Diagnosis not present

## 2018-03-31 DIAGNOSIS — M545 Low back pain: Secondary | ICD-10-CM | POA: Diagnosis not present

## 2018-03-31 DIAGNOSIS — R262 Difficulty in walking, not elsewhere classified: Secondary | ICD-10-CM | POA: Diagnosis not present

## 2018-04-02 DIAGNOSIS — M5136 Other intervertebral disc degeneration, lumbar region: Secondary | ICD-10-CM | POA: Diagnosis not present

## 2018-04-02 DIAGNOSIS — M5416 Radiculopathy, lumbar region: Secondary | ICD-10-CM | POA: Diagnosis not present

## 2018-04-02 DIAGNOSIS — M47816 Spondylosis without myelopathy or radiculopathy, lumbar region: Secondary | ICD-10-CM | POA: Diagnosis not present

## 2018-04-04 DIAGNOSIS — M545 Low back pain: Secondary | ICD-10-CM | POA: Diagnosis not present

## 2018-04-04 DIAGNOSIS — M6281 Muscle weakness (generalized): Secondary | ICD-10-CM | POA: Diagnosis not present

## 2018-04-04 DIAGNOSIS — R262 Difficulty in walking, not elsewhere classified: Secondary | ICD-10-CM | POA: Diagnosis not present

## 2018-04-08 DIAGNOSIS — M545 Low back pain: Secondary | ICD-10-CM | POA: Diagnosis not present

## 2018-04-08 DIAGNOSIS — R262 Difficulty in walking, not elsewhere classified: Secondary | ICD-10-CM | POA: Diagnosis not present

## 2018-04-08 DIAGNOSIS — M6281 Muscle weakness (generalized): Secondary | ICD-10-CM | POA: Diagnosis not present

## 2018-04-10 DIAGNOSIS — M6281 Muscle weakness (generalized): Secondary | ICD-10-CM | POA: Diagnosis not present

## 2018-04-10 DIAGNOSIS — R262 Difficulty in walking, not elsewhere classified: Secondary | ICD-10-CM | POA: Diagnosis not present

## 2018-04-10 DIAGNOSIS — M545 Low back pain: Secondary | ICD-10-CM | POA: Diagnosis not present

## 2018-04-15 DIAGNOSIS — R262 Difficulty in walking, not elsewhere classified: Secondary | ICD-10-CM | POA: Diagnosis not present

## 2018-04-15 DIAGNOSIS — M6281 Muscle weakness (generalized): Secondary | ICD-10-CM | POA: Diagnosis not present

## 2018-04-15 DIAGNOSIS — M545 Low back pain: Secondary | ICD-10-CM | POA: Diagnosis not present

## 2018-04-18 DIAGNOSIS — R262 Difficulty in walking, not elsewhere classified: Secondary | ICD-10-CM | POA: Diagnosis not present

## 2018-04-18 DIAGNOSIS — M6281 Muscle weakness (generalized): Secondary | ICD-10-CM | POA: Diagnosis not present

## 2018-04-18 DIAGNOSIS — M545 Low back pain: Secondary | ICD-10-CM | POA: Diagnosis not present

## 2018-04-19 DIAGNOSIS — J069 Acute upper respiratory infection, unspecified: Secondary | ICD-10-CM | POA: Diagnosis not present

## 2018-04-19 DIAGNOSIS — R05 Cough: Secondary | ICD-10-CM | POA: Diagnosis not present

## 2018-05-05 DIAGNOSIS — M25561 Pain in right knee: Secondary | ICD-10-CM | POA: Diagnosis not present

## 2018-05-05 DIAGNOSIS — M7581 Other shoulder lesions, right shoulder: Secondary | ICD-10-CM | POA: Diagnosis not present

## 2018-05-05 DIAGNOSIS — G8929 Other chronic pain: Secondary | ICD-10-CM | POA: Diagnosis not present

## 2018-05-05 DIAGNOSIS — M25511 Pain in right shoulder: Secondary | ICD-10-CM | POA: Diagnosis not present

## 2018-05-05 DIAGNOSIS — M25861 Other specified joint disorders, right knee: Secondary | ICD-10-CM | POA: Diagnosis not present

## 2018-05-05 DIAGNOSIS — M19011 Primary osteoarthritis, right shoulder: Secondary | ICD-10-CM | POA: Diagnosis not present

## 2018-05-06 ENCOUNTER — Other Ambulatory Visit: Payer: Self-pay | Admitting: Student

## 2018-05-06 DIAGNOSIS — M7581 Other shoulder lesions, right shoulder: Secondary | ICD-10-CM

## 2018-05-06 DIAGNOSIS — M19011 Primary osteoarthritis, right shoulder: Secondary | ICD-10-CM

## 2018-05-06 DIAGNOSIS — R262 Difficulty in walking, not elsewhere classified: Secondary | ICD-10-CM | POA: Diagnosis not present

## 2018-05-06 DIAGNOSIS — M545 Low back pain: Secondary | ICD-10-CM | POA: Diagnosis not present

## 2018-05-06 DIAGNOSIS — M6281 Muscle weakness (generalized): Secondary | ICD-10-CM | POA: Diagnosis not present

## 2018-05-08 DIAGNOSIS — R262 Difficulty in walking, not elsewhere classified: Secondary | ICD-10-CM | POA: Diagnosis not present

## 2018-05-08 DIAGNOSIS — M545 Low back pain: Secondary | ICD-10-CM | POA: Diagnosis not present

## 2018-05-08 DIAGNOSIS — M6281 Muscle weakness (generalized): Secondary | ICD-10-CM | POA: Diagnosis not present

## 2018-05-13 DIAGNOSIS — M545 Low back pain: Secondary | ICD-10-CM | POA: Diagnosis not present

## 2018-05-13 DIAGNOSIS — M6281 Muscle weakness (generalized): Secondary | ICD-10-CM | POA: Diagnosis not present

## 2018-05-13 DIAGNOSIS — R262 Difficulty in walking, not elsewhere classified: Secondary | ICD-10-CM | POA: Diagnosis not present

## 2018-05-15 DIAGNOSIS — R262 Difficulty in walking, not elsewhere classified: Secondary | ICD-10-CM | POA: Diagnosis not present

## 2018-05-15 DIAGNOSIS — M545 Low back pain: Secondary | ICD-10-CM | POA: Diagnosis not present

## 2018-05-15 DIAGNOSIS — M6281 Muscle weakness (generalized): Secondary | ICD-10-CM | POA: Diagnosis not present

## 2018-05-20 ENCOUNTER — Other Ambulatory Visit: Payer: Self-pay | Admitting: Family Medicine

## 2018-05-20 DIAGNOSIS — Z1231 Encounter for screening mammogram for malignant neoplasm of breast: Secondary | ICD-10-CM

## 2018-05-21 ENCOUNTER — Ambulatory Visit
Admission: RE | Admit: 2018-05-21 | Discharge: 2018-05-21 | Disposition: A | Discharge status: Home / Self Care | Payer: Medicare HMO | Source: Ambulatory Visit | Attending: Student | Admitting: Student

## 2018-05-21 DIAGNOSIS — M7581 Other shoulder lesions, right shoulder: Secondary | ICD-10-CM | POA: Diagnosis not present

## 2018-05-21 DIAGNOSIS — M19011 Primary osteoarthritis, right shoulder: Secondary | ICD-10-CM

## 2018-05-21 DIAGNOSIS — M25511 Pain in right shoulder: Secondary | ICD-10-CM | POA: Diagnosis not present

## 2018-06-02 DIAGNOSIS — M7581 Other shoulder lesions, right shoulder: Secondary | ICD-10-CM | POA: Diagnosis not present

## 2018-06-02 DIAGNOSIS — M7521 Bicipital tendinitis, right shoulder: Secondary | ICD-10-CM | POA: Diagnosis not present

## 2018-06-02 DIAGNOSIS — M1712 Unilateral primary osteoarthritis, left knee: Secondary | ICD-10-CM | POA: Diagnosis not present

## 2018-06-10 ENCOUNTER — Ambulatory Visit
Admission: RE | Admit: 2018-06-10 | Discharge: 2018-06-10 | Disposition: A | Discharge status: Home / Self Care | Payer: Medicare HMO | Source: Ambulatory Visit | Attending: Family Medicine | Admitting: Family Medicine

## 2018-06-10 DIAGNOSIS — Z1231 Encounter for screening mammogram for malignant neoplasm of breast: Secondary | ICD-10-CM | POA: Insufficient documentation

## 2018-06-17 ENCOUNTER — Encounter
Admission: RE | Admit: 2018-06-17 | Discharge: 2018-06-17 | Disposition: A | Discharge status: Home / Self Care | Payer: Medicare HMO | Source: Ambulatory Visit | Attending: Surgery | Admitting: Surgery

## 2018-06-17 ENCOUNTER — Other Ambulatory Visit: Payer: Self-pay

## 2018-06-17 DIAGNOSIS — Z01818 Encounter for other preprocedural examination: Secondary | ICD-10-CM | POA: Insufficient documentation

## 2018-06-17 DIAGNOSIS — E785 Hyperlipidemia, unspecified: Secondary | ICD-10-CM | POA: Insufficient documentation

## 2018-06-17 DIAGNOSIS — I10 Essential (primary) hypertension: Secondary | ICD-10-CM | POA: Diagnosis not present

## 2018-06-17 DIAGNOSIS — R21 Rash and other nonspecific skin eruption: Secondary | ICD-10-CM | POA: Diagnosis not present

## 2018-06-17 HISTORY — DX: Age-related osteoporosis without current pathological fracture: M81.0

## 2018-06-17 HISTORY — DX: Other specified postprocedural states: Z98.890

## 2018-06-17 HISTORY — DX: Unspecified convulsions: R56.9

## 2018-06-17 HISTORY — DX: Umbilical hernia without obstruction or gangrene: K42.9

## 2018-06-17 HISTORY — DX: Polyneuropathy, unspecified: G62.9

## 2018-06-17 HISTORY — DX: Hyperlipidemia, unspecified: E78.5

## 2018-06-17 HISTORY — DX: Epilepsy, unspecified, not intractable, without status epilepticus: G40.909

## 2018-06-17 HISTORY — DX: Other specified postprocedural states: R11.2

## 2018-06-17 HISTORY — DX: Essential (primary) hypertension: I10

## 2018-06-17 HISTORY — DX: Barrett's esophagus without dysplasia: K22.70

## 2018-06-17 HISTORY — DX: Unspecified glaucoma: H40.9

## 2018-06-17 HISTORY — DX: Unspecified osteoarthritis, unspecified site: M19.90

## 2018-06-17 HISTORY — DX: Migraine, unspecified, not intractable, without status migrainosus: G43.909

## 2018-06-17 HISTORY — DX: Gastro-esophageal reflux disease without esophagitis: K21.9

## 2018-06-17 NOTE — Pre-Procedure Instructions (Signed)
   ECG 12-lead3/02/2018 Branch Component Name Value Ref Range  Vent Rate (bpm) 79   PR Interval (msec) 142   QRS Interval (msec) 88   QT Interval (msec) 388   QTc (msec) 444   Other Result Information  This result has an attachment that is not available.  Result Narrative  Normal sinus rhythm Left axis deviation Septal infarct , age undetermined Abnormal ECG No previous ECGs available I reviewed and concur with this report. Electronically signed BS:[email or web address], MD, JAMES [phone]) on 07/21/2017 11:05:21 AM

## 2018-06-17 NOTE — Patient Instructions (Signed)
Your procedure is scheduled on: Thursday 06/26/2018 Report to [facility]. To find out your arrival time please call [phone] between 1PM - 3PM on Wednesday 06/25/2018.  Remember: Instructions that are not followed completely may result in serious medical risk, up to and including death, or upon the discretion of your surgeon and anesthesiologist your surgery may need to be rescheduled.     _X__ 1. Do not eat food after midnight the night before your procedure.                 No gum chewing or hard candies. You may drink clear liquids up to 2 hours                 before you are scheduled to arrive for your surgery- DO not drink clear                 liquids within 2 hours of the start of your surgery.                 Clear Liquids include:  water, apple juice without pulp, clear carbohydrate                 drink such as Clearfast or Gatorade, Black Coffee or Tea (Do not add                 anything to coffee or tea).  __X__2.  On the morning of surgery brush your teeth with toothpaste and water, you                 may rinse your mouth with mouthwash if you wish.  Do not swallow any              toothpaste of mouthwash.     _X__ 3.  No Alcohol for 24 hours before or after surgery.   _X__ 4.  Do Not Smoke or use e-cigarettes For 24 Hours Prior to Your Surgery.                 Do not use any chewable tobacco products for at least 6 hours prior to                 surgery.  ____  5.  Bring all medications with you on the day of surgery if instructed.   __X__  6.  Notify your doctor if there is any change in your medical condition      (cold, fever, infections).     Do not wear jewelry, make-up, hairpins, clips or nail polish. Do not wear lotions, powders, or perfumes.  Do not shave 48 hours prior to surgery. Men may shave face and neck. Do not bring valuables to the hospital.    [facility] is not responsible for any belongings or  valuables.  Contacts, dentures/partials or body piercings may not be worn into surgery. Bring a case for your contacts, glasses or hearing aids, a denture cup will be supplied. Leave your suitcase in the car. After surgery it may be brought to your room. For patients admitted to the hospital, discharge time is determined by your treatment team.   Patients discharged the day of surgery will not be allowed to drive home.   Please read over the following fact sheets that you were given:   MRSA Information  __X__ Take these medicines the morning of surgery with A SIP OF WATER:  1. metoprolol tartrate (LOPRESSOR)  2. ranitidine (ZANTAC)  3.   4.  5.  6.  ____ Fleet Enema (as directed)   __X__ Use CHG Soap/SAGE wipes as directed  ____ Use inhalers on the day of surgery  ____ Stop metformin/Janumet/Farxiga 2 days prior to surgery    ____ Take 1/2 of usual insulin dose the night before surgery. No insulin the morning          of surgery.   ____ Stop Blood Thinners Coumadin/Plavix/Xarelto/Pleta/Pradaxa/Eliquis/Effient/Aspirin  on   Or contact your Surgeon, Cardiologist or Medical Doctor regarding  ability to stop your blood thinners  __X__ Stop Anti-inflammatories 7 days before surgery such as Advil, Ibuprofen, Motrin,  BC or Goodies Powder, Naprosyn, Naproxen, Aleve, Aspirin    __X__ Stop all herbal supplements, fish oil or vitamin E until after surgery. STOP GINKO TODAY   ____ Bring C-Pap to the hospital.

## 2018-06-25 MED ORDER — CEFAZOLIN SODIUM-DEXTROSE 2-4 GM/100ML-% IV SOLN
2.0000 g | Freq: Once | INTRAVENOUS | Status: AC
  Administered 2018-06-26: 2 g via INTRAVENOUS

## 2018-06-26 ENCOUNTER — Encounter: Payer: Self-pay | Admitting: *Deleted

## 2018-06-26 ENCOUNTER — Ambulatory Visit: Payer: Medicare HMO | Admitting: Certified Registered"

## 2018-06-26 ENCOUNTER — Other Ambulatory Visit: Payer: Self-pay

## 2018-06-26 ENCOUNTER — Encounter
Admission: RE | Disposition: A | Discharge status: Home / Self Care | Payer: Self-pay | Source: Ambulatory Visit | Attending: Surgery

## 2018-06-26 ENCOUNTER — Ambulatory Visit
Admission: RE | Admit: 2018-06-26 | Discharge: 2018-06-26 | Disposition: A | Discharge status: Home / Self Care | Payer: Medicare HMO | Source: Ambulatory Visit | Attending: Surgery | Admitting: Surgery

## 2018-06-26 DIAGNOSIS — Z803 Family history of malignant neoplasm of breast: Secondary | ICD-10-CM | POA: Insufficient documentation

## 2018-06-26 DIAGNOSIS — Z8261 Family history of arthritis: Secondary | ICD-10-CM | POA: Diagnosis not present

## 2018-06-26 DIAGNOSIS — Z79899 Other long term (current) drug therapy: Secondary | ICD-10-CM | POA: Diagnosis not present

## 2018-06-26 DIAGNOSIS — I10 Essential (primary) hypertension: Secondary | ICD-10-CM | POA: Diagnosis not present

## 2018-06-26 DIAGNOSIS — Z7982 Long term (current) use of aspirin: Secondary | ICD-10-CM | POA: Diagnosis not present

## 2018-06-26 DIAGNOSIS — M19011 Primary osteoarthritis, right shoulder: Secondary | ICD-10-CM | POA: Diagnosis not present

## 2018-06-26 DIAGNOSIS — G40909 Epilepsy, unspecified, not intractable, without status epilepticus: Secondary | ICD-10-CM | POA: Insufficient documentation

## 2018-06-26 DIAGNOSIS — R51 Headache: Secondary | ICD-10-CM | POA: Diagnosis not present

## 2018-06-26 DIAGNOSIS — Z90710 Acquired absence of both cervix and uterus: Secondary | ICD-10-CM | POA: Insufficient documentation

## 2018-06-26 DIAGNOSIS — H409 Unspecified glaucoma: Secondary | ICD-10-CM | POA: Insufficient documentation

## 2018-06-26 DIAGNOSIS — M7521 Bicipital tendinitis, right shoulder: Secondary | ICD-10-CM | POA: Diagnosis not present

## 2018-06-26 DIAGNOSIS — Z87891 Personal history of nicotine dependence: Secondary | ICD-10-CM | POA: Insufficient documentation

## 2018-06-26 DIAGNOSIS — M7581 Other shoulder lesions, right shoulder: Secondary | ICD-10-CM | POA: Diagnosis not present

## 2018-06-26 DIAGNOSIS — M25511 Pain in right shoulder: Secondary | ICD-10-CM | POA: Diagnosis not present

## 2018-06-26 DIAGNOSIS — Z823 Family history of stroke: Secondary | ICD-10-CM | POA: Insufficient documentation

## 2018-06-26 DIAGNOSIS — E785 Hyperlipidemia, unspecified: Secondary | ICD-10-CM | POA: Insufficient documentation

## 2018-06-26 DIAGNOSIS — Z8262 Family history of osteoporosis: Secondary | ICD-10-CM | POA: Diagnosis not present

## 2018-06-26 DIAGNOSIS — M810 Age-related osteoporosis without current pathological fracture: Secondary | ICD-10-CM | POA: Diagnosis not present

## 2018-06-26 DIAGNOSIS — Z820 Family history of epilepsy and other diseases of the nervous system: Secondary | ICD-10-CM | POA: Insufficient documentation

## 2018-06-26 DIAGNOSIS — K219 Gastro-esophageal reflux disease without esophagitis: Secondary | ICD-10-CM | POA: Insufficient documentation

## 2018-06-26 DIAGNOSIS — M7541 Impingement syndrome of right shoulder: Secondary | ICD-10-CM | POA: Insufficient documentation

## 2018-06-26 DIAGNOSIS — G8918 Other acute postprocedural pain: Secondary | ICD-10-CM | POA: Diagnosis not present

## 2018-06-26 HISTORY — PX: SHOULDER ARTHROSCOPY WITH SUBACROMIAL DECOMPRESSION AND BICEP TENDON REPAIR: SHX5689

## 2018-06-26 SURGERY — SHOULDER ARTHROSCOPY WITH SUBACROMIAL DECOMPRESSION AND BICEP TENDON REPAIR
Anesthesia: General | Laterality: Right

## 2018-06-26 MED ORDER — FENTANYL CITRATE (PF) 100 MCG/2ML IJ SOLN
50.0000 ug | Freq: Once | INTRAMUSCULAR | Status: AC
  Administered 2018-06-26: 50 ug via INTRAVENOUS

## 2018-06-26 MED ORDER — GLYCOPYRROLATE 0.2 MG/ML IJ SOLN
INTRAMUSCULAR | Status: AC
  Filled 2018-06-26: qty 4

## 2018-06-26 MED ORDER — PROPOFOL 500 MG/50ML IV EMUL
INTRAVENOUS | Status: AC
  Filled 2018-06-26: qty 50

## 2018-06-26 MED ORDER — ROCURONIUM BROMIDE 50 MG/5ML IV SOLN
INTRAVENOUS | Status: AC
  Filled 2018-06-26: qty 4

## 2018-06-26 MED ORDER — ROCURONIUM BROMIDE 100 MG/10ML IV SOLN
INTRAVENOUS | Status: DC | PRN
  Administered 2018-06-26: 5 mg via INTRAVENOUS
  Administered 2018-06-26: 45 mg via INTRAVENOUS

## 2018-06-26 MED ORDER — LIDOCAINE HCL (PF) 2 % IJ SOLN
INTRAMUSCULAR | Status: AC
  Filled 2018-06-26: qty 30

## 2018-06-26 MED ORDER — FENTANYL CITRATE (PF) 100 MCG/2ML IJ SOLN: 25.0000 ug | INTRAMUSCULAR | Status: DC | PRN

## 2018-06-26 MED ORDER — METOCLOPRAMIDE HCL 10 MG PO TABS: 5.0000 mg | ORAL_TABLET | Freq: Three times a day (TID) | ORAL | Status: DC | PRN

## 2018-06-26 MED ORDER — LIDOCAINE HCL (PF) 1 % IJ SOLN
INTRAMUSCULAR | Status: AC
  Filled 2018-06-26: qty 5

## 2018-06-26 MED ORDER — SODIUM CHLORIDE 0.9 % IV SOLN
INTRAVENOUS | Status: DC | PRN
  Administered 2018-06-26: 15 ug/min via INTRAVENOUS

## 2018-06-26 MED ORDER — METOCLOPRAMIDE HCL 5 MG/ML IJ SOLN: 5.0000 mg | Freq: Three times a day (TID) | INTRAMUSCULAR | Status: DC | PRN

## 2018-06-26 MED ORDER — DEXAMETHASONE SODIUM PHOSPHATE 10 MG/ML IJ SOLN
INTRAMUSCULAR | Status: AC
  Filled 2018-06-26: qty 1

## 2018-06-26 MED ORDER — DEXMEDETOMIDINE HCL IN NACL 80 MCG/20ML IV SOLN
INTRAVENOUS | Status: AC
  Filled 2018-06-26: qty 20

## 2018-06-26 MED ORDER — ACETAMINOPHEN 10 MG/ML IV SOLN
INTRAVENOUS | Status: AC
  Filled 2018-06-26: qty 100

## 2018-06-26 MED ORDER — SUCCINYLCHOLINE CHLORIDE 20 MG/ML IJ SOLN
INTRAMUSCULAR | Status: DC | PRN
  Administered 2018-06-26: 100 mg via INTRAVENOUS

## 2018-06-26 MED ORDER — EPINEPHRINE PF 1 MG/ML IJ SOLN
INTRAMUSCULAR | Status: AC
  Filled 2018-06-26: qty 3

## 2018-06-26 MED ORDER — PHENYLEPHRINE HCL 10 MG/ML IJ SOLN
INTRAMUSCULAR | Status: DC | PRN
  Administered 2018-06-26: 100 ug via INTRAVENOUS

## 2018-06-26 MED ORDER — CEFAZOLIN SODIUM-DEXTROSE 2-4 GM/100ML-% IV SOLN
INTRAVENOUS | Status: AC
  Filled 2018-06-26: qty 100

## 2018-06-26 MED ORDER — BUPIVACAINE-EPINEPHRINE 0.5% -1:200000 IJ SOLN
INTRAMUSCULAR | Status: DC | PRN
  Administered 2018-06-26: 10 mL

## 2018-06-26 MED ORDER — PROPOFOL 10 MG/ML IV BOLUS
INTRAVENOUS | Status: DC | PRN
  Administered 2018-06-26: 150 mg via INTRAVENOUS

## 2018-06-26 MED ORDER — LACTATED RINGERS IV SOLN
INTRAVENOUS | Status: DC
  Administered 2018-06-26 (×2): via INTRAVENOUS

## 2018-06-26 MED ORDER — MIDAZOLAM HCL 2 MG/2ML IJ SOLN
INTRAMUSCULAR | Status: AC
  Administered 2018-06-26: 1 mg via INTRAVENOUS
  Filled 2018-06-26: qty 2

## 2018-06-26 MED ORDER — ACETAMINOPHEN 10 MG/ML IV SOLN
INTRAVENOUS | Status: DC | PRN
  Administered 2018-06-26: 1000 mg via INTRAVENOUS

## 2018-06-26 MED ORDER — TRAMADOL HCL 50 MG PO TABS: 50.0000 mg | ORAL_TABLET | Freq: Four times a day (QID) | ORAL | 0 refills | Status: DC | PRN

## 2018-06-26 MED ORDER — ONDANSETRON HCL 4 MG/2ML IJ SOLN: 4.0000 mg | Freq: Once | INTRAMUSCULAR | Status: DC | PRN

## 2018-06-26 MED ORDER — GLYCOPYRROLATE 0.2 MG/ML IJ SOLN
INTRAMUSCULAR | Status: DC | PRN
  Administered 2018-06-26: 0.2 mg via INTRAVENOUS

## 2018-06-26 MED ORDER — BUPIVACAINE LIPOSOME 1.3 % IJ SUSP
INTRAMUSCULAR | Status: AC
  Filled 2018-06-26: qty 20

## 2018-06-26 MED ORDER — SUGAMMADEX SODIUM 500 MG/5ML IV SOLN
INTRAVENOUS | Status: AC
  Filled 2018-06-26: qty 15

## 2018-06-26 MED ORDER — DEXAMETHASONE SODIUM PHOSPHATE 10 MG/ML IJ SOLN
INTRAMUSCULAR | Status: AC
  Filled 2018-06-26: qty 4

## 2018-06-26 MED ORDER — EPHEDRINE SULFATE 50 MG/ML IJ SOLN
INTRAMUSCULAR | Status: AC
  Filled 2018-06-26: qty 1

## 2018-06-26 MED ORDER — FENTANYL CITRATE (PF) 100 MCG/2ML IJ SOLN
INTRAMUSCULAR | Status: DC | PRN
  Administered 2018-06-26: 50 ug via INTRAVENOUS

## 2018-06-26 MED ORDER — ONDANSETRON HCL 4 MG/2ML IJ SOLN
INTRAMUSCULAR | Status: AC
  Filled 2018-06-26: qty 8

## 2018-06-26 MED ORDER — ONDANSETRON HCL 4 MG/2ML IJ SOLN: 4.0000 mg | Freq: Four times a day (QID) | INTRAMUSCULAR | Status: DC | PRN

## 2018-06-26 MED ORDER — ONDANSETRON HCL 4 MG PO TABS: 4.0000 mg | ORAL_TABLET | Freq: Four times a day (QID) | ORAL | Status: DC | PRN

## 2018-06-26 MED ORDER — POTASSIUM CHLORIDE IN NACL 20-0.9 MEQ/L-% IV SOLN: INTRAVENOUS | Status: DC

## 2018-06-26 MED ORDER — BUPIVACAINE HCL (PF) 0.5 % IJ SOLN
INTRAMUSCULAR | Status: AC
  Filled 2018-06-26: qty 30

## 2018-06-26 MED ORDER — LACTATED RINGERS IV SOLN
INTRAVENOUS | Status: DC | PRN
  Administered 2018-06-26: 3000 mL

## 2018-06-26 MED ORDER — FENTANYL CITRATE (PF) 100 MCG/2ML IJ SOLN
INTRAMUSCULAR | Status: AC
  Administered 2018-06-26: 50 ug via INTRAVENOUS
  Filled 2018-06-26: qty 2

## 2018-06-26 MED ORDER — LIDOCAINE HCL (CARDIAC) PF 100 MG/5ML IV SOSY
PREFILLED_SYRINGE | INTRAVENOUS | Status: DC | PRN
  Administered 2018-06-26: 100 mg via INTRAVENOUS

## 2018-06-26 MED ORDER — SUCCINYLCHOLINE CHLORIDE 20 MG/ML IJ SOLN
INTRAMUSCULAR | Status: AC
  Filled 2018-06-26: qty 4

## 2018-06-26 MED ORDER — BUPIVACAINE HCL (PF) 0.5 % IJ SOLN
INTRAMUSCULAR | Status: DC | PRN
  Administered 2018-06-26: 10 mL

## 2018-06-26 MED ORDER — ONDANSETRON HCL 4 MG/2ML IJ SOLN
INTRAMUSCULAR | Status: DC | PRN
  Administered 2018-06-26: 4 mg via INTRAVENOUS

## 2018-06-26 MED ORDER — BUPIVACAINE LIPOSOME 1.3 % IJ SUSP
INTRAMUSCULAR | Status: DC | PRN
  Administered 2018-06-26: 20 mL

## 2018-06-26 MED ORDER — DEXAMETHASONE SODIUM PHOSPHATE 10 MG/ML IJ SOLN
INTRAMUSCULAR | Status: DC | PRN
  Administered 2018-06-26: 10 mg via INTRAVENOUS

## 2018-06-26 MED ORDER — MIDAZOLAM HCL 2 MG/2ML IJ SOLN
1.0000 mg | Freq: Once | INTRAMUSCULAR | Status: AC
  Administered 2018-06-26: 1 mg via INTRAVENOUS

## 2018-06-26 MED ORDER — SUGAMMADEX SODIUM 500 MG/5ML IV SOLN
INTRAVENOUS | Status: DC | PRN
  Administered 2018-06-26: 160 mg via INTRAVENOUS

## 2018-06-26 MED ORDER — BUPIVACAINE HCL (PF) 0.5 % IJ SOLN
INTRAMUSCULAR | Status: AC
  Filled 2018-06-26: qty 10

## 2018-06-26 MED ORDER — FENTANYL CITRATE (PF) 100 MCG/2ML IJ SOLN
INTRAMUSCULAR | Status: AC
  Filled 2018-06-26: qty 2

## 2018-06-26 MED ORDER — EPHEDRINE SULFATE 50 MG/ML IJ SOLN
INTRAMUSCULAR | Status: AC
  Filled 2018-06-26: qty 3

## 2018-06-26 SURGICAL SUPPLY — 43 items
ANCHOR JUGGERKNOT WTAP NDL 2.9 (Anchor) ×3 IMPLANT
BIT DRILL JUGRKNT W/NDL BIT2.9 (DRILL) ×1 IMPLANT
BLADE FULL RADIUS 3.5 (BLADE) ×3 IMPLANT
BUR ACROMIONIZER 4.0 (BURR) ×3 IMPLANT
CANNULA SHAVER 8MMX76MM (CANNULA) ×3 IMPLANT
CHLORAPREP W/TINT 26ML (MISCELLANEOUS) ×3 IMPLANT
COVER MAYO STAND STRL (DRAPES) ×3 IMPLANT
COVER WAND RF STERILE (DRAPES) ×3 IMPLANT
DRAPE IMP U-DRAPE 54X76 (DRAPES) ×6 IMPLANT
DRILL JUGGERKNOT W/NDL BIT 2.9 (DRILL) ×3
ELECT REM PT RETURN 9FT ADLT (ELECTROSURGICAL) ×3
ELECTRODE REM PT RTRN 9FT ADLT (ELECTROSURGICAL) ×1 IMPLANT
GAUZE PETRO XEROFOAM 1X8 (MISCELLANEOUS) ×3 IMPLANT
GAUZE SPONGE 4X4 12PLY STRL (GAUZE/BANDAGES/DRESSINGS) ×3 IMPLANT
GLOVE BIO SURGEON STRL SZ7.5 (GLOVE) ×6 IMPLANT
GLOVE BIO SURGEON STRL SZ8 (GLOVE) ×6 IMPLANT
GLOVE BIOGEL PI IND STRL 8 (GLOVE) ×1 IMPLANT
GLOVE BIOGEL PI INDICATOR 8 (GLOVE) ×2
GLOVE INDICATOR 8.0 STRL GRN (GLOVE) ×3 IMPLANT
GOWN STRL REUS W/ TWL LRG LVL3 (GOWN DISPOSABLE) ×1 IMPLANT
GOWN STRL REUS W/ TWL XL LVL3 (GOWN DISPOSABLE) ×1 IMPLANT
GOWN STRL REUS W/TWL LRG LVL3 (GOWN DISPOSABLE) ×2
GOWN STRL REUS W/TWL XL LVL3 (GOWN DISPOSABLE) ×2
GRASPER SUT 15 45D LOW PRO (SUTURE) IMPLANT
IV LACTATED RINGER IRRG 3000ML (IV SOLUTION) ×4
IV LR IRRIG 3000ML ARTHROMATIC (IV SOLUTION) ×2 IMPLANT
MANIFOLD NEPTUNE II (INSTRUMENTS) ×3 IMPLANT
MASK FACE SPIDER DISP (MASK) ×3 IMPLANT
MAT ABSORB  FLUID 56X50 GRAY (MISCELLANEOUS) ×2
MAT ABSORB FLUID 56X50 GRAY (MISCELLANEOUS) ×1 IMPLANT
PACK ARTHROSCOPY SHOULDER (MISCELLANEOUS) ×3 IMPLANT
SLING ARM LRG DEEP (SOFTGOODS) ×3 IMPLANT
SLING ULTRA II LG (MISCELLANEOUS) ×3 IMPLANT
STAPLER SKIN PROX 35W (STAPLE) ×3 IMPLANT
STRAP SAFETY 5IN WIDE (MISCELLANEOUS) ×3 IMPLANT
SUT ETHIBOND 0 MO6 C/R (SUTURE) ×3 IMPLANT
SUT VIC AB 2-0 CT1 27 (SUTURE) ×4
SUT VIC AB 2-0 CT1 TAPERPNT 27 (SUTURE) ×2 IMPLANT
TAPE MICROFOAM 4IN (TAPE) ×3 IMPLANT
TUBING ARTHRO INFLOW-ONLY STRL (TUBING) ×3 IMPLANT
TUBING CONNECTING 10 (TUBING) ×2 IMPLANT
TUBING CONNECTING 10' (TUBING) ×1
WAND WEREWOLF FLOW 90D (MISCELLANEOUS) ×3 IMPLANT

## 2018-06-26 NOTE — Anesthesia Procedure Notes (Signed)
Anesthesia Regional Block: Interscalene brachial plexus block   Pre-Anesthetic Checklist: ,, timeout performed, Correct Patient, Correct Site, Correct Laterality, Correct Procedure, Correct Position, site marked, Risks and benefits discussed,  Surgical consent,  Pre-op evaluation,  At surgeon's request and post-op pain management  Laterality: Right  Prep: chloraprep, alcohol swabs       Needles:  Injection technique: Single-shot  Needle Type: Stimiplex     Needle Length: 5cm  Needle Gauge: 22     Additional Needles:   Procedures:, nerve stimulator,,, ultrasound used (permanent image in chart),,,,   Nerve Stimulator or Paresthesia:  Response: biceps flexion,   Additional Responses:   Narrative:  Start time: 06/26/2018 9:50 AM End time: 06/26/2018 10:00 AM Injection made incrementally with aspirations every 5 mL.  Performed by: Personally   Additional Notes: Time out called.  Patient placed in semi sitting position with a shoulder roll under the right shoulder.  The head was tilted to the left side.  A scout film was made and the landmarks were identified with a line drawn lateral to the probe.  The area was cleaned with alcohol and a skin wheal was made along the line with 1% Lidocaine plain.  The area was prepped and draped in sterile fashion with chloroprep.  The US probe was used in sterile fashion to reidentify the apprpriate landmarks.  The stimuplex needle was advanced to a lateral position from the nerve bundle and the nerve twitch was carried down to 0.5 mAmps before fade.  The solution of 20 cc of exparel and 10 cc of 0.5% Marcaine plain was incrementally injected with negative aspirations.  No pain on injection and easy injection.  The patient tolerated the procedure well.

## 2018-06-26 NOTE — Op Note (Signed)
06/26/2018  11:46 AM  Patient:   Danielle Armstrong  Pre-Op Diagnosis:   Impingement/tendinopathy with biceps tendinopathy, right shoulder.  Post-Op Diagnosis:   Impingement/tendinopathy with degenerative joint disease, labral fraying, and biceps tendinopathy, right shoulder.  Procedure:   Extensive arthroscopic debridement, arthroscopic subacromial decompression, and mini-open biceps tenodesis, right shoulder.  Anesthesia:   General endotracheal with interscalene block placed preoperatively by the anesthesiologist.  Surgeon:   Pascal Lux, MD  Assistant:   Morley Kos, PA-C; Orland Penman, PA-S  Findings:   As above. There were diffuse grade 3 chondromalacial changes involving both the glenoid and humerus, as well as moderate labral fraying anteriorly, superiorly, and posterior superiorly without frank detachment from the glenoid. The rotator cuff itself appeared to be in good condition when viewed from both the articular and bursal surfaces, other than some mild thickening involving the mid and posterior portions of the supraspinatus. The biceps tendon demonstrated moderate "lip sticking", but had no fraying or tears.  Complications:   None  Fluids:   900 cc  Estimated blood loss:   5 cc  Tourniquet time:   None  Drains:   None  Closure:   Staples      Brief clinical note:   The patient is a 73 year old female with a history of right shoulder pain. The patient's symptoms have progressed despite medications, activity modification, etc. The patient's history and examination are consistent with impingement/tendinopathy with a rotator cuff tear. These findings were confirmed by MRI scan. The patient presents at this time for definitive management of these shoulder symptoms.  Procedure:   The patient underwent placement of an interscalene block by the anesthesiologist in the preoperative holding area before being brought into the operating room and lain in the supine position. The patient  then underwent general endotracheal intubation and anesthesia before being repositioned in the beach chair position using the beach chair positioner. The right shoulder and upper extremity were prepped with ChloraPrep solution before being draped sterilely. Preoperative antibiotics were administered. A timeout was performed to confirm the proper surgical site before the expected portal sites and incision site were injected with 0.5% Sensorcaine with epinephrine. A posterior portal was created and the glenohumeral joint thoroughly inspected with the findings as described above. An anterior portal was created using an outside-in technique. The labrum and rotator cuff were further probed, again confirming the above-noted findings.  Areas of labral fraying were debrided back to stable margins using the full-radius resector, as were areas of synovitis and grade 3 chondromalacial changes on both the humerus and glenoid. The ArthroCare wand was inserted and used to release the biceps tendon from its labral anchor. It also was used to obtain hemostasis as well as to "anneal" the labrum superiorly and anteriorly. The instruments were removed from the joint after suctioning the excess fluid.  The camera was repositioned through the posterior portal into the subacromial space. A separate lateral portal was created using an outside-in technique. The 3.5 mm full-radius resector was introduced and used to perform a subtotal bursectomy. The ArthroCare wand was then inserted and used to remove the periosteal tissue off the undersurface of the anterior third of the acromion as well as to recess the coracoacromial ligament from its attachment along the anterior and lateral margins of the acromion. The 4.0 mm acromionizing bur was introduced and used to complete the decompression by removing the undersurface of the anterior third of the acromion. The full radius resector was reintroduced to remove any residual  bony debris before the  ArthroCare wand was reintroduced to obtain hemostasis. The instruments were then removed from the subacromial space after suctioning the excess fluid.  An approximately 4-5 cm incision was made over the anterolateral aspect of the shoulder beginning at the anterolateral corner of the acromion and extending distally in line with the bicipital groove. This incision was carried down through the subcutaneous tissues to expose the deltoid fascia. The raphae between the anterior and middle thirds was identified and this plane developed to provide access into the subacromial space. Additional bursal tissues were debrided sharply using Metzenbaum scissors. The rotator cuff tear was carefully inspected.  There was no evidence of any bursal surface tearing.  The bicipital groove was identified by palpation and opened for 1-1.5 cm. The biceps tendon stump was retrieved through this defect. The floor of the bicipital groove was roughened with a curet before a a single Biomet 2.9 mm JuggerKnot anchor was inserted. Both sets of sutures were passed through the biceps tendon and tied securely to effect the tenodesis. The bicipital sheath was reapproximated using two #0 Ethibond interrupted sutures, incorporating the biceps tendon to further reinforce the tenodesis.  The wound was copiously irrigated with sterile saline solution before the deltoid raphae was reapproximated using 2-0 Vicryl interrupted sutures. The subcutaneous tissues were closed in two layers using 2-0 Vicryl interrupted sutures before the skin was closed using staples. The portal sites also were closed using staples. A sterile bulky dressing was applied to the shoulder before the arm was placed into a shoulder immobilizer. The patient was then awakened, extubated, and returned to the recovery room in satisfactory condition after tolerating the procedure well.

## 2018-06-26 NOTE — Anesthesia Post-op Follow-up Note (Signed)
Anesthesia QCDR form completed.        

## 2018-06-26 NOTE — Transfer of Care (Signed)
Immediate Anesthesia Transfer of Care Note  Patient: Danielle Armstrong  Procedure(s) Performed: SHOULDER ARTHROSCOPY WITH DEBRIDEMENT, DECOMPRESSION, POSSIBLE ROTATOR CUFF REPAIR AND BICEP TENODESIS-RIGHT (Right )  Patient Location: PACU  Anesthesia Type:General  Level of Consciousness: awake, alert  and patient cooperative  Airway & Oxygen Therapy: Patient Spontanous Breathing and Patient connected to face mask oxygen  Post-op Assessment: Report given to RN and Post -op Vital signs reviewed and stable  Post vital signs: Reviewed and stable  Last Vitals:  Vitals Value Taken Time  BP    Temp    Pulse    Resp    SpO2      Last Pain:  Vitals:   06/26/18 1204  TempSrc:   PainSc: (P) Asleep         Complications: No apparent anesthesia complications

## 2018-06-26 NOTE — Discharge Instructions (Signed)
Interscalene Nerve Block with Exparel  1.  For your surgery you have received an Interscalene Nerve Block with Exparel. 2. Nerve Blocks affect many types of nerves, including nerves that control movement, pain and normal sensation.  You may experience feelings such as numbness, tingling, heaviness, weakness or the inability to move your arm or the feeling or sensation that your arm has "fallen asleep". 3. A nerve block with Exparel can last up to 5 days.  Usually the weakness wears off first.  The tingling and heaviness usually wear off next.  Finally you may start to notice pain.  Keep in mind that this may occur in any order.  Once a nerve block starts to wear off it is usually completely gone within 60 minutes. 4. ISNB may cause mild shortness of breath, a hoarse voice, blurry vision, unequal pupils, or drooping of the face on the same side as the nerve block.  These symptoms will usually resolve with the numbness.  Very rarely the procedure itself can cause mild seizures. 5. If needed, your surgeon will give you a prescription for pain medication.  It will take about 60 minutes for the oral pain medication to become fully effective.  So, it is recommended that you start taking this medication before the nerve block first begins to wear off, or when you first begin to feel discomfort. 6. Take your pain medication only as prescribed.  Pain medication can cause sedation and decrease your breathing if you take more than you need for the level of pain that you have. 7. Nausea is a common side effect of many pain medications.  You may want to eat something before taking your pain medicine to prevent nausea. 8. After an Interscalene nerve block, you cannot feel pain, pressure or extremes in temperature in the effected arm.  Because your arm is numb it is at an increased risk for injury.  To decrease the possibility of injury, please practice the following:  a. While you are awake change the position of  your arm frequently to prevent too much pressure on any one area for prolonged periods of time. b.  If you have a cast or tight dressing, check the color or your fingers every couple of hours.  Call your surgeon with the appearance of any discoloration (white or blue). c. If you are given a sling to wear before you go home, please wear it  at all times until the block has completely worn off.  Do not get up at night without your sling. d. Please contact [facility] or your surgeon if you do not begin to regain sensation after 7 days from the surgery.  Anesthesia may be contacted by calling the Same Day Surgery Department, Mon. through Fri., 6 am to 4 pm at [phone].   e. If you experience any other problems or concerns, please contact your surgeon's office. f. If you experience severe or prolonged shortness of breath go to the nearest emergency department.   Orthopedic discharge instructions: Keep dressing dry and intact.  May shower after dressing changed on post-op day #4 (Monday).  Cover staples with Band-Aids after drying off. Apply ice frequently to shoulder. Take ibuprofen 800 mg TID with meals for 7-10 days, then as necessary. Take Tramadol as prescribed when needed.  May supplement with ES Tylenol if necessary. Keep shoulder immobilizer on at all times except may remove for bathing purposes. Follow-up in 10-14 days or as scheduled.

## 2018-06-26 NOTE — Anesthesia Procedure Notes (Signed)
Procedure Name: Intubation Date/Time: 06/26/2018 10:34 AM Performed by: Kelton Pillar, CRNA Pre-anesthesia Checklist: Patient identified, Emergency Drugs available, Patient being monitored and Suction available Oxygen Delivery Method: Circle system utilized Preoxygenation: Pre-oxygenation with 100% oxygen Induction Type: IV induction Ventilation: Mask ventilation without difficulty Laryngoscope Size: Mac and 3 Grade View: Grade I Tube type: Oral Tube size: 6.5 mm Number of attempts: 1 Airway Equipment and Method: Stylet Placement Confirmation: ETT inserted through vocal cords under direct vision,  positive ETCO2,  CO2 detector and breath sounds checked- equal and bilateral Secured at: 22 cm Tube secured with: Tape Dental Injury: Teeth and Oropharynx as per pre-operative assessment

## 2018-06-26 NOTE — H&P (Signed)
Paper H&P to be scanned into permanent record. H&P reviewed and patient re-examined. No changes. 

## 2018-06-26 NOTE — Anesthesia Preprocedure Evaluation (Addendum)
Anesthesia Evaluation  Patient identified by MRN, date of birth, ID band Patient awake    Reviewed: Allergy & Precautions, NPO status , Patient's Chart, lab work & pertinent test results  History of Anesthesia Complications (+) PONV and history of anesthetic complications  Airway Mallampati: III       Dental  (+) Upper Dentures   Pulmonary former smoker,    Pulmonary exam normal        Cardiovascular hypertension, Normal cardiovascular exam     Neuro/Psych  Headaches, Seizures -, Well Controlled,  negative psych ROS   GI/Hepatic Neg liver ROS, GERD  ,  Endo/Other  negative endocrine ROS  Renal/GU negative Renal ROS  Female GU complaint     Musculoskeletal  (+) Arthritis , Osteoarthritis,    Abdominal Normal abdominal exam  (+)   Peds negative pediatric ROS (+)  Hematology negative hematology ROS (+)   Anesthesia Other Findings Past Medical History: No date: Barretts esophagus 1992: Cancer ([place])     Comment:  cervical No date: Epilepsy ([place])     Comment:  nonoe in years No date: GERD (gastroesophageal reflux disease) No date: Glaucoma No date: HLD (hyperlipidemia) No date: HTN (hypertension) No date: Migraines No date: Neuropathy No date: Osteoarthritis No date: Osteoporosis No date: PONV (postoperative nausea and vomiting)     Comment:  hospitalized x 1 wk unable to keep anything down orally               1992 No date: Seizures ([place])     Comment:  none in years No date: Umbilical hernia  Reproductive/Obstetrics                            Anesthesia Physical Anesthesia Plan  ASA: II  Anesthesia Plan: General   Post-op Pain Management:  Regional for Post-op pain   Induction: Intravenous  PONV Risk Score and Plan:   Airway Management Planned: Oral ETT  Additional Equipment:   Intra-op Plan:   Post-operative Plan: Extubation in OR  Informed Consent: I have  reviewed the patients History and Physical, chart, labs and discussed the procedure including the risks, benefits and alternatives for the proposed anesthesia with the patient or authorized representative who has indicated his/her understanding and acceptance.     Dental advisory given  Plan Discussed with: CRNA and Surgeon  Anesthesia Plan Comments: (Discussed interscalene block for post op pain control.  The risks were discussed which included nerve damage, infection, pneumothorax, difficulty breathing, seizures among others.  The patient understands risks and wishes to proceed with the procedure.)       Anesthesia Quick Evaluation

## 2018-06-27 NOTE — Anesthesia Postprocedure Evaluation (Signed)
Anesthesia Post Note  Patient: Danielle Armstrong  Procedure(s) Performed: SHOULDER ARTHROSCOPY WITH DEBRIDEMENT, DECOMPRESSION, POSSIBLE ROTATOR CUFF REPAIR AND BICEP TENODESIS-RIGHT (Right )  Patient location during evaluation: PACU Anesthesia Type: General Level of consciousness: awake and alert and oriented Pain management: pain level controlled Vital Signs Assessment: post-procedure vital signs reviewed and stable Respiratory status: spontaneous breathing Cardiovascular status: blood pressure returned to baseline Anesthetic complications: no     Last Vitals:  Vitals:   06/26/18 1333 06/26/18 1349  BP: (!) 138/58 131/65  Pulse: 70 83  Resp: 14 14  Temp:    SpO2: 97% 94%    Last Pain:  Vitals:   06/26/18 1349  TempSrc:   PainSc: 0-No pain                 Danielle Armstrong

## 2018-07-01 DIAGNOSIS — M25511 Pain in right shoulder: Secondary | ICD-10-CM | POA: Diagnosis not present

## 2018-07-01 DIAGNOSIS — M25611 Stiffness of right shoulder, not elsewhere classified: Secondary | ICD-10-CM | POA: Diagnosis not present

## 2018-07-01 DIAGNOSIS — M6281 Muscle weakness (generalized): Secondary | ICD-10-CM | POA: Diagnosis not present

## 2018-07-04 DIAGNOSIS — M25511 Pain in right shoulder: Secondary | ICD-10-CM | POA: Diagnosis not present

## 2018-07-04 DIAGNOSIS — M6281 Muscle weakness (generalized): Secondary | ICD-10-CM | POA: Diagnosis not present

## 2018-07-04 DIAGNOSIS — M25611 Stiffness of right shoulder, not elsewhere classified: Secondary | ICD-10-CM | POA: Diagnosis not present

## 2018-07-07 DIAGNOSIS — M25611 Stiffness of right shoulder, not elsewhere classified: Secondary | ICD-10-CM | POA: Diagnosis not present

## 2018-07-07 DIAGNOSIS — M25511 Pain in right shoulder: Secondary | ICD-10-CM | POA: Diagnosis not present

## 2018-07-08 DIAGNOSIS — K219 Gastro-esophageal reflux disease without esophagitis: Secondary | ICD-10-CM | POA: Diagnosis not present

## 2018-07-08 DIAGNOSIS — Z23 Encounter for immunization: Secondary | ICD-10-CM | POA: Diagnosis not present

## 2018-07-08 DIAGNOSIS — G43909 Migraine, unspecified, not intractable, without status migrainosus: Secondary | ICD-10-CM | POA: Diagnosis not present

## 2018-07-08 DIAGNOSIS — B3789 Other sites of candidiasis: Secondary | ICD-10-CM | POA: Diagnosis not present

## 2018-07-08 DIAGNOSIS — I10 Essential (primary) hypertension: Secondary | ICD-10-CM | POA: Diagnosis not present

## 2018-07-08 DIAGNOSIS — Z0000 Encounter for general adult medical examination without abnormal findings: Secondary | ICD-10-CM | POA: Diagnosis not present

## 2018-07-08 DIAGNOSIS — R413 Other amnesia: Secondary | ICD-10-CM | POA: Diagnosis not present

## 2018-07-10 DIAGNOSIS — M25511 Pain in right shoulder: Secondary | ICD-10-CM | POA: Diagnosis not present

## 2018-07-10 DIAGNOSIS — M25611 Stiffness of right shoulder, not elsewhere classified: Secondary | ICD-10-CM | POA: Diagnosis not present

## 2018-07-15 DIAGNOSIS — M25611 Stiffness of right shoulder, not elsewhere classified: Secondary | ICD-10-CM | POA: Diagnosis not present

## 2018-07-15 DIAGNOSIS — M25511 Pain in right shoulder: Secondary | ICD-10-CM | POA: Diagnosis not present

## 2018-07-15 DIAGNOSIS — M6281 Muscle weakness (generalized): Secondary | ICD-10-CM | POA: Diagnosis not present

## 2018-07-18 DIAGNOSIS — M25611 Stiffness of right shoulder, not elsewhere classified: Secondary | ICD-10-CM | POA: Diagnosis not present

## 2018-07-18 DIAGNOSIS — M25511 Pain in right shoulder: Secondary | ICD-10-CM | POA: Diagnosis not present

## 2018-07-22 DIAGNOSIS — M25611 Stiffness of right shoulder, not elsewhere classified: Secondary | ICD-10-CM | POA: Diagnosis not present

## 2018-07-22 DIAGNOSIS — M6281 Muscle weakness (generalized): Secondary | ICD-10-CM | POA: Diagnosis not present

## 2018-07-22 DIAGNOSIS — M25511 Pain in right shoulder: Secondary | ICD-10-CM | POA: Diagnosis not present

## 2018-07-24 DIAGNOSIS — M25611 Stiffness of right shoulder, not elsewhere classified: Secondary | ICD-10-CM | POA: Diagnosis not present

## 2018-07-24 DIAGNOSIS — M6281 Muscle weakness (generalized): Secondary | ICD-10-CM | POA: Diagnosis not present

## 2018-07-24 DIAGNOSIS — M25511 Pain in right shoulder: Secondary | ICD-10-CM | POA: Diagnosis not present

## 2018-07-29 DIAGNOSIS — M6281 Muscle weakness (generalized): Secondary | ICD-10-CM | POA: Diagnosis not present

## 2018-07-29 DIAGNOSIS — M25511 Pain in right shoulder: Secondary | ICD-10-CM | POA: Diagnosis not present

## 2018-07-29 DIAGNOSIS — M25611 Stiffness of right shoulder, not elsewhere classified: Secondary | ICD-10-CM | POA: Diagnosis not present

## 2018-07-31 DIAGNOSIS — M25511 Pain in right shoulder: Secondary | ICD-10-CM | POA: Diagnosis not present

## 2018-07-31 DIAGNOSIS — M25611 Stiffness of right shoulder, not elsewhere classified: Secondary | ICD-10-CM | POA: Diagnosis not present

## 2018-07-31 DIAGNOSIS — M6281 Muscle weakness (generalized): Secondary | ICD-10-CM | POA: Diagnosis not present

## 2018-08-05 DIAGNOSIS — M25511 Pain in right shoulder: Secondary | ICD-10-CM | POA: Diagnosis not present

## 2018-08-05 DIAGNOSIS — R262 Difficulty in walking, not elsewhere classified: Secondary | ICD-10-CM | POA: Diagnosis not present

## 2018-08-05 DIAGNOSIS — M6281 Muscle weakness (generalized): Secondary | ICD-10-CM | POA: Diagnosis not present

## 2018-08-05 DIAGNOSIS — M25611 Stiffness of right shoulder, not elsewhere classified: Secondary | ICD-10-CM | POA: Diagnosis not present

## 2018-08-07 DIAGNOSIS — M25611 Stiffness of right shoulder, not elsewhere classified: Secondary | ICD-10-CM | POA: Diagnosis not present

## 2018-08-07 DIAGNOSIS — M25511 Pain in right shoulder: Secondary | ICD-10-CM | POA: Diagnosis not present

## 2018-08-07 DIAGNOSIS — M6281 Muscle weakness (generalized): Secondary | ICD-10-CM | POA: Diagnosis not present

## 2018-08-07 DIAGNOSIS — R262 Difficulty in walking, not elsewhere classified: Secondary | ICD-10-CM | POA: Diagnosis not present

## 2018-08-11 DIAGNOSIS — M7521 Bicipital tendinitis, right shoulder: Secondary | ICD-10-CM | POA: Diagnosis not present

## 2018-08-12 DIAGNOSIS — M25611 Stiffness of right shoulder, not elsewhere classified: Secondary | ICD-10-CM | POA: Diagnosis not present

## 2018-08-12 DIAGNOSIS — M25511 Pain in right shoulder: Secondary | ICD-10-CM | POA: Diagnosis not present

## 2018-08-12 DIAGNOSIS — M6281 Muscle weakness (generalized): Secondary | ICD-10-CM | POA: Diagnosis not present

## 2018-08-14 DIAGNOSIS — M6281 Muscle weakness (generalized): Secondary | ICD-10-CM | POA: Diagnosis not present

## 2018-08-14 DIAGNOSIS — M25611 Stiffness of right shoulder, not elsewhere classified: Secondary | ICD-10-CM | POA: Diagnosis not present

## 2018-08-14 DIAGNOSIS — M25511 Pain in right shoulder: Secondary | ICD-10-CM | POA: Diagnosis not present

## 2018-08-18 DIAGNOSIS — M6281 Muscle weakness (generalized): Secondary | ICD-10-CM | POA: Diagnosis not present

## 2018-08-18 DIAGNOSIS — R262 Difficulty in walking, not elsewhere classified: Secondary | ICD-10-CM | POA: Diagnosis not present

## 2018-08-18 DIAGNOSIS — M25611 Stiffness of right shoulder, not elsewhere classified: Secondary | ICD-10-CM | POA: Diagnosis not present

## 2018-08-18 DIAGNOSIS — M25511 Pain in right shoulder: Secondary | ICD-10-CM | POA: Diagnosis not present

## 2018-08-20 DIAGNOSIS — M25611 Stiffness of right shoulder, not elsewhere classified: Secondary | ICD-10-CM | POA: Diagnosis not present

## 2018-08-20 DIAGNOSIS — M25511 Pain in right shoulder: Secondary | ICD-10-CM | POA: Diagnosis not present

## 2018-08-20 DIAGNOSIS — M6281 Muscle weakness (generalized): Secondary | ICD-10-CM | POA: Diagnosis not present

## 2018-08-25 DIAGNOSIS — M25611 Stiffness of right shoulder, not elsewhere classified: Secondary | ICD-10-CM | POA: Diagnosis not present

## 2018-08-25 DIAGNOSIS — M25511 Pain in right shoulder: Secondary | ICD-10-CM | POA: Diagnosis not present

## 2018-08-25 DIAGNOSIS — M6281 Muscle weakness (generalized): Secondary | ICD-10-CM | POA: Diagnosis not present

## 2018-08-28 DIAGNOSIS — M6281 Muscle weakness (generalized): Secondary | ICD-10-CM | POA: Diagnosis not present

## 2018-08-28 DIAGNOSIS — M25511 Pain in right shoulder: Secondary | ICD-10-CM | POA: Diagnosis not present

## 2018-08-28 DIAGNOSIS — M25611 Stiffness of right shoulder, not elsewhere classified: Secondary | ICD-10-CM | POA: Diagnosis not present

## 2018-09-01 DIAGNOSIS — M6281 Muscle weakness (generalized): Secondary | ICD-10-CM | POA: Diagnosis not present

## 2018-09-01 DIAGNOSIS — M25611 Stiffness of right shoulder, not elsewhere classified: Secondary | ICD-10-CM | POA: Diagnosis not present

## 2018-09-01 DIAGNOSIS — M25511 Pain in right shoulder: Secondary | ICD-10-CM | POA: Diagnosis not present

## 2018-09-04 DIAGNOSIS — M6281 Muscle weakness (generalized): Secondary | ICD-10-CM | POA: Diagnosis not present

## 2018-09-04 DIAGNOSIS — M25611 Stiffness of right shoulder, not elsewhere classified: Secondary | ICD-10-CM | POA: Diagnosis not present

## 2018-09-04 DIAGNOSIS — M25511 Pain in right shoulder: Secondary | ICD-10-CM | POA: Diagnosis not present

## 2018-09-09 DIAGNOSIS — M25511 Pain in right shoulder: Secondary | ICD-10-CM | POA: Diagnosis not present

## 2018-09-09 DIAGNOSIS — M6281 Muscle weakness (generalized): Secondary | ICD-10-CM | POA: Diagnosis not present

## 2018-09-09 DIAGNOSIS — M25611 Stiffness of right shoulder, not elsewhere classified: Secondary | ICD-10-CM | POA: Diagnosis not present

## 2018-09-11 DIAGNOSIS — M25611 Stiffness of right shoulder, not elsewhere classified: Secondary | ICD-10-CM | POA: Diagnosis not present

## 2018-09-11 DIAGNOSIS — M25511 Pain in right shoulder: Secondary | ICD-10-CM | POA: Diagnosis not present

## 2018-09-16 DIAGNOSIS — M25511 Pain in right shoulder: Secondary | ICD-10-CM | POA: Diagnosis not present

## 2018-09-16 DIAGNOSIS — M25611 Stiffness of right shoulder, not elsewhere classified: Secondary | ICD-10-CM | POA: Diagnosis not present

## 2018-09-16 DIAGNOSIS — M6281 Muscle weakness (generalized): Secondary | ICD-10-CM | POA: Diagnosis not present

## 2018-09-18 DIAGNOSIS — M6281 Muscle weakness (generalized): Secondary | ICD-10-CM | POA: Diagnosis not present

## 2018-09-18 DIAGNOSIS — M25611 Stiffness of right shoulder, not elsewhere classified: Secondary | ICD-10-CM | POA: Diagnosis not present

## 2018-09-18 DIAGNOSIS — M25511 Pain in right shoulder: Secondary | ICD-10-CM | POA: Diagnosis not present

## 2018-09-22 DIAGNOSIS — M65331 Trigger finger, right middle finger: Secondary | ICD-10-CM | POA: Diagnosis not present

## 2018-09-22 DIAGNOSIS — M659 Synovitis and tenosynovitis, unspecified: Secondary | ICD-10-CM | POA: Diagnosis not present

## 2018-09-23 DIAGNOSIS — M25611 Stiffness of right shoulder, not elsewhere classified: Secondary | ICD-10-CM | POA: Diagnosis not present

## 2018-09-23 DIAGNOSIS — M6281 Muscle weakness (generalized): Secondary | ICD-10-CM | POA: Diagnosis not present

## 2018-09-23 DIAGNOSIS — M25511 Pain in right shoulder: Secondary | ICD-10-CM | POA: Diagnosis not present

## 2018-09-25 DIAGNOSIS — M6281 Muscle weakness (generalized): Secondary | ICD-10-CM | POA: Diagnosis not present

## 2018-09-25 DIAGNOSIS — M25611 Stiffness of right shoulder, not elsewhere classified: Secondary | ICD-10-CM | POA: Diagnosis not present

## 2018-09-25 DIAGNOSIS — M25511 Pain in right shoulder: Secondary | ICD-10-CM | POA: Diagnosis not present

## 2018-09-30 DIAGNOSIS — M25511 Pain in right shoulder: Secondary | ICD-10-CM | POA: Diagnosis not present

## 2018-09-30 DIAGNOSIS — M25611 Stiffness of right shoulder, not elsewhere classified: Secondary | ICD-10-CM | POA: Diagnosis not present

## 2018-09-30 DIAGNOSIS — M6281 Muscle weakness (generalized): Secondary | ICD-10-CM | POA: Diagnosis not present

## 2018-10-02 DIAGNOSIS — M25511 Pain in right shoulder: Secondary | ICD-10-CM | POA: Diagnosis not present

## 2018-10-02 DIAGNOSIS — M6281 Muscle weakness (generalized): Secondary | ICD-10-CM | POA: Diagnosis not present

## 2018-10-02 DIAGNOSIS — M25611 Stiffness of right shoulder, not elsewhere classified: Secondary | ICD-10-CM | POA: Diagnosis not present

## 2018-10-07 DIAGNOSIS — M25511 Pain in right shoulder: Secondary | ICD-10-CM | POA: Diagnosis not present

## 2018-10-07 DIAGNOSIS — M25611 Stiffness of right shoulder, not elsewhere classified: Secondary | ICD-10-CM | POA: Diagnosis not present

## 2018-10-07 DIAGNOSIS — M6281 Muscle weakness (generalized): Secondary | ICD-10-CM | POA: Diagnosis not present

## 2018-10-09 DIAGNOSIS — M25611 Stiffness of right shoulder, not elsewhere classified: Secondary | ICD-10-CM | POA: Diagnosis not present

## 2018-10-09 DIAGNOSIS — M25511 Pain in right shoulder: Secondary | ICD-10-CM | POA: Diagnosis not present

## 2018-10-09 DIAGNOSIS — M6281 Muscle weakness (generalized): Secondary | ICD-10-CM | POA: Diagnosis not present

## 2018-10-16 DIAGNOSIS — M25511 Pain in right shoulder: Secondary | ICD-10-CM | POA: Diagnosis not present

## 2018-10-16 DIAGNOSIS — M25611 Stiffness of right shoulder, not elsewhere classified: Secondary | ICD-10-CM | POA: Diagnosis not present

## 2018-10-16 DIAGNOSIS — M6281 Muscle weakness (generalized): Secondary | ICD-10-CM | POA: Diagnosis not present

## 2018-10-21 DIAGNOSIS — M25511 Pain in right shoulder: Secondary | ICD-10-CM | POA: Diagnosis not present

## 2018-10-21 DIAGNOSIS — M6281 Muscle weakness (generalized): Secondary | ICD-10-CM | POA: Diagnosis not present

## 2018-10-21 DIAGNOSIS — M25611 Stiffness of right shoulder, not elsewhere classified: Secondary | ICD-10-CM | POA: Diagnosis not present

## 2018-10-23 DIAGNOSIS — M6281 Muscle weakness (generalized): Secondary | ICD-10-CM | POA: Diagnosis not present

## 2018-10-23 DIAGNOSIS — M25611 Stiffness of right shoulder, not elsewhere classified: Secondary | ICD-10-CM | POA: Diagnosis not present

## 2018-10-23 DIAGNOSIS — M25511 Pain in right shoulder: Secondary | ICD-10-CM | POA: Diagnosis not present

## 2018-10-28 DIAGNOSIS — M6281 Muscle weakness (generalized): Secondary | ICD-10-CM | POA: Diagnosis not present

## 2018-10-28 DIAGNOSIS — M25511 Pain in right shoulder: Secondary | ICD-10-CM | POA: Diagnosis not present

## 2018-10-28 DIAGNOSIS — M25611 Stiffness of right shoulder, not elsewhere classified: Secondary | ICD-10-CM | POA: Diagnosis not present

## 2018-11-03 DIAGNOSIS — M7581 Other shoulder lesions, right shoulder: Secondary | ICD-10-CM | POA: Diagnosis not present

## 2018-11-03 DIAGNOSIS — M19011 Primary osteoarthritis, right shoulder: Secondary | ICD-10-CM | POA: Diagnosis not present

## 2018-11-03 DIAGNOSIS — M1711 Unilateral primary osteoarthritis, right knee: Secondary | ICD-10-CM | POA: Diagnosis not present

## 2018-11-03 DIAGNOSIS — M7541 Impingement syndrome of right shoulder: Secondary | ICD-10-CM | POA: Diagnosis not present

## 2018-11-03 DIAGNOSIS — M7521 Bicipital tendinitis, right shoulder: Secondary | ICD-10-CM | POA: Diagnosis not present

## 2019-06-19 ENCOUNTER — Other Ambulatory Visit: Payer: Self-pay | Admitting: Family Medicine

## 2019-06-19 DIAGNOSIS — Z1231 Encounter for screening mammogram for malignant neoplasm of breast: Secondary | ICD-10-CM

## 2019-07-14 ENCOUNTER — Ambulatory Visit
Admission: RE | Admit: 2019-07-14 | Discharge: 2019-07-14 | Disposition: A | Discharge status: Home / Self Care | Payer: Medicare HMO | Source: Ambulatory Visit | Attending: Family Medicine | Admitting: Family Medicine

## 2019-07-14 DIAGNOSIS — Z1231 Encounter for screening mammogram for malignant neoplasm of breast: Secondary | ICD-10-CM | POA: Insufficient documentation

## 2019-07-27 DIAGNOSIS — I10 Essential (primary) hypertension: Secondary | ICD-10-CM | POA: Diagnosis not present

## 2019-08-03 DIAGNOSIS — I10 Essential (primary) hypertension: Secondary | ICD-10-CM | POA: Diagnosis not present

## 2019-08-03 DIAGNOSIS — M659 Synovitis and tenosynovitis, unspecified: Secondary | ICD-10-CM | POA: Diagnosis not present

## 2019-08-03 DIAGNOSIS — G609 Hereditary and idiopathic neuropathy, unspecified: Secondary | ICD-10-CM | POA: Diagnosis not present

## 2019-08-03 DIAGNOSIS — Z23 Encounter for immunization: Secondary | ICD-10-CM | POA: Diagnosis not present

## 2019-08-03 DIAGNOSIS — M722 Plantar fascial fibromatosis: Secondary | ICD-10-CM | POA: Diagnosis not present

## 2019-08-03 DIAGNOSIS — K219 Gastro-esophageal reflux disease without esophagitis: Secondary | ICD-10-CM | POA: Diagnosis not present

## 2019-08-03 DIAGNOSIS — Z0000 Encounter for general adult medical examination without abnormal findings: Secondary | ICD-10-CM | POA: Diagnosis not present

## 2019-08-03 DIAGNOSIS — R195 Other fecal abnormalities: Secondary | ICD-10-CM | POA: Diagnosis not present

## 2019-08-14 DIAGNOSIS — M65331 Trigger finger, right middle finger: Secondary | ICD-10-CM | POA: Diagnosis not present

## 2020-01-29 DIAGNOSIS — M65331 Trigger finger, right middle finger: Secondary | ICD-10-CM | POA: Diagnosis not present

## 2020-02-19 ENCOUNTER — Other Ambulatory Visit: Payer: Self-pay | Admitting: Surgery

## 2020-02-19 DIAGNOSIS — H35372 Puckering of macula, left eye: Secondary | ICD-10-CM | POA: Diagnosis not present

## 2020-02-23 ENCOUNTER — Encounter
Admission: RE | Admit: 2020-02-23 | Discharge: 2020-02-23 | Disposition: A | Discharge status: Home / Self Care | Payer: Medicare HMO | Source: Ambulatory Visit | Attending: Surgery | Admitting: Surgery

## 2020-02-23 ENCOUNTER — Other Ambulatory Visit: Payer: Self-pay

## 2020-02-23 NOTE — Patient Instructions (Addendum)
Your procedure is scheduled on:03-02-20 [facility] Report to Day Surgery on the 2nd floor of the [place]. To find out your arrival time, please call [phone] between 1PM - 3PM on:03-01-20 TUESDAY  REMEMBER: Instructions that are not followed completely may result in serious medical risk, up to and including death; or upon the discretion of your surgeon and anesthesiologist your surgery may need to be rescheduled.  Do not eat food after midnight the night before surgery.  No gum chewing, lozengers or hard candies.  You may however, drink CLEAR liquids up to 2 hours before you are scheduled to arrive for your surgery. Do not drink anything within 2 hours of your scheduled arrival time.  Clear liquids include: - water  - apple juice without pulp - gatorade (not RED, PURPLE, OR BLUE) - black coffee or tea (Do NOT add milk or creamers to the coffee or tea) Do NOT drink anything that is not on this list.  In addition, your doctor has ordered for you to drink the provided  Ensure Pre-Surgery Clear Carbohydrate Drink  Drinking this carbohydrate drink up to two hours before surgery helps to reduce insulin resistance and improve patient outcomes. Please complete drinking 2 hours prior to scheduled arrival time.  TAKE THESE MEDICATIONS THE MORNING OF SURGERY WITH A SIP OF WATER: -ZYRTEC (CETIRIZINE) -METOPROLOL (TOPROL) -PROTONIX (PANTOPRAZOLE)  One week prior to surgery: Stop Anti-inflammatories (NSAIDS) such as Advil, Aleve, Ibuprofen, Motrin, Naproxen, Naprosyn and Aspirin based products such as Excedrin, Goodys Powder, BC Powder-OK TO TAKE TYLENOL IF NEEDED  Stop ANY OVER THE COUNTER supplements until after surgery-STOP YOUR ALPHA LIPOIC ACID, GINGKO BILOBA, JUICE PLUS, NEURIVA AND HERPESYL NOW-YOU MAY RESUME AFTER SURGERY (You may continue taking fiber choice supplement)  No Alcohol for 24 hours before or after surgery.  No Smoking including e-cigarettes for 24 hours prior to  surgery.  No chewable tobacco products for at least 6 hours prior to surgery.  No nicotine patches on the day of surgery.  Do not use any "recreational" drugs for at least a week prior to your surgery.  Please be advised that the combination of cocaine and anesthesia may have negative outcomes, up to and including death. If you test positive for cocaine, your surgery will be cancelled.  On the morning of surgery brush your teeth with toothpaste and water, you may rinse your mouth with mouthwash if you wish. Do not swallow any toothpaste or mouthwash.  Do not wear jewelry, make-up, hairpins, clips or nail polish.  Do not wear lotions, powders, or perfumes.   Do not shave 48 hours prior to surgery.   Contact lenses, hearing aids and dentures may not be worn into surgery.  Do not bring valuables to the hospital. [facility] is not responsible for any missing/lost belongings or valuables.   Use CHG Soap as directed on instruction sheet  Notify your doctor if there is any change in your medical condition (cold, fever, infection).  Wear comfortable clothing (specific to your surgery type) to the hospital.  Plan for stool softeners for home use; pain medications have a tendency to cause constipation. You can also help prevent constipation by eating foods high in fiber such as fruits and vegetables and drinking plenty of fluids as your diet allows.  After surgery, you can help prevent lung complications by doing breathing exercises.  Take deep breaths and cough every 1-2 hours. Your doctor may order a device called an Incentive Spirometer to help you take deep breaths.  When coughing or sneezing, hold a pillow firmly against your incision with both hands. This is called splinting. Doing this helps protect your incision. It also decreases belly discomfort.  If you are being admitted to the hospital overnight, leave your suitcase in the car. After surgery it may be brought to your  room.  If you are being discharged the day of surgery, you will not be allowed to drive home. You will need a responsible adult (18 years or older) to drive you home and stay with you that night.   If you are taking public transportation, you will need to have a responsible adult (18 years or older) with you. Please confirm with your physician that it is acceptable to use public transportation.   Please call the [facility] at [phone] if you have any questions about these instructions.  Visitation Policy:  Patients undergoing a surgery or procedure may have one family member or support person with them as long as that person is not COVID-19 positive or experiencing its symptoms.  That person may remain in the waiting area during the procedure.  Inpatient Visitation Update:   In an effort to ensure the safety of our team members and our patients, we are implementing a change to our visitation policy:  Effective Monday, Aug. 9, at 7 a.m., inpatients will be allowed one support person.  o The support person may change daily.  o The support person must pass our screening, gel in and out, and wear a mask at all times, including in the patients room.  o Patients must also wear a mask when staff or their support person are in the room.  o Masking is required regardless of vaccination status.  Systemwide, no visitors 17 or younger.

## 2020-02-25 ENCOUNTER — Encounter
Admission: RE | Admit: 2020-02-25 | Discharge: 2020-02-25 | Disposition: A | Discharge status: Home / Self Care | Payer: Medicare HMO | Source: Ambulatory Visit | Attending: Surgery | Admitting: Surgery

## 2020-02-25 ENCOUNTER — Other Ambulatory Visit: Payer: Self-pay

## 2020-02-25 DIAGNOSIS — I10 Essential (primary) hypertension: Secondary | ICD-10-CM | POA: Insufficient documentation

## 2020-02-25 DIAGNOSIS — Z01810 Encounter for preprocedural cardiovascular examination: Secondary | ICD-10-CM | POA: Diagnosis not present

## 2020-02-29 ENCOUNTER — Other Ambulatory Visit: Payer: Self-pay

## 2020-02-29 ENCOUNTER — Other Ambulatory Visit
Admission: RE | Admit: 2020-02-29 | Discharge: 2020-02-29 | Disposition: A | Discharge status: Home / Self Care | Payer: Medicare HMO | Source: Ambulatory Visit | Attending: Surgery | Admitting: Surgery

## 2020-02-29 DIAGNOSIS — Z20822 Contact with and (suspected) exposure to covid-19: Secondary | ICD-10-CM | POA: Insufficient documentation

## 2020-02-29 DIAGNOSIS — Z01818 Encounter for other preprocedural examination: Secondary | ICD-10-CM | POA: Diagnosis not present

## 2020-03-01 LAB — SARS CORONAVIRUS 2 (TAT 6-24 HRS): SARS Coronavirus 2: NEGATIVE

## 2020-03-02 ENCOUNTER — Encounter: Payer: Self-pay | Admitting: Surgery

## 2020-03-02 ENCOUNTER — Encounter
Admission: RE | Disposition: A | Discharge status: Home / Self Care | Payer: Self-pay | Source: Home / Self Care | Attending: Surgery

## 2020-03-02 ENCOUNTER — Ambulatory Visit: Payer: Medicare HMO

## 2020-03-02 ENCOUNTER — Other Ambulatory Visit: Payer: Self-pay

## 2020-03-02 ENCOUNTER — Ambulatory Visit
Admission: RE | Admit: 2020-03-02 | Discharge: 2020-03-02 | Disposition: A | Discharge status: Home / Self Care | Payer: Medicare HMO | Attending: Surgery | Admitting: Surgery

## 2020-03-02 DIAGNOSIS — M65331 Trigger finger, right middle finger: Secondary | ICD-10-CM | POA: Insufficient documentation

## 2020-03-02 HISTORY — PX: TRIGGER FINGER RELEASE: SHX641

## 2020-03-02 SURGERY — RELEASE, A1 PULLEY, FOR TRIGGER FINGER
Anesthesia: General | Site: Middle Finger | Laterality: Right

## 2020-03-02 MED ORDER — CEFAZOLIN SODIUM-DEXTROSE 2-4 GM/100ML-% IV SOLN
2.0000 g | INTRAVENOUS | Status: AC
  Administered 2020-03-02: 2 g via INTRAVENOUS

## 2020-03-02 MED ORDER — LORAZEPAM 2 MG/ML IJ SOLN: 1.0000 mg | Freq: Once | INTRAMUSCULAR | Status: DC | PRN

## 2020-03-02 MED ORDER — CEFAZOLIN SODIUM-DEXTROSE 2-4 GM/100ML-% IV SOLN
INTRAVENOUS | Status: AC
  Filled 2020-03-02: qty 100

## 2020-03-02 MED ORDER — ORAL CARE MOUTH RINSE: 15.0000 mL | Freq: Once | OROMUCOSAL | Status: AC

## 2020-03-02 MED ORDER — DEXMEDETOMIDINE (PRECEDEX) IN NS 20 MCG/5ML (4 MCG/ML) IV SYRINGE
PREFILLED_SYRINGE | INTRAVENOUS | Status: DC | PRN
  Administered 2020-03-02 (×2): 4 ug via INTRAVENOUS

## 2020-03-02 MED ORDER — PROPOFOL 500 MG/50ML IV EMUL
INTRAVENOUS | Status: DC | PRN
  Administered 2020-03-02: 100 ug/kg/min via INTRAVENOUS

## 2020-03-02 MED ORDER — HYDROMORPHONE HCL 1 MG/ML IJ SOLN: 0.2500 mg | INTRAMUSCULAR | Status: DC | PRN

## 2020-03-02 MED ORDER — PROPOFOL 10 MG/ML IV BOLUS
INTRAVENOUS | Status: AC
  Filled 2020-03-02: qty 20

## 2020-03-02 MED ORDER — MEPERIDINE HCL 50 MG/ML IJ SOLN: 6.2500 mg | INTRAMUSCULAR | Status: DC | PRN

## 2020-03-02 MED ORDER — FENTANYL CITRATE (PF) 100 MCG/2ML IJ SOLN
INTRAMUSCULAR | Status: AC
  Filled 2020-03-02: qty 2

## 2020-03-02 MED ORDER — DEXMEDETOMIDINE (PRECEDEX) IN NS 20 MCG/5ML (4 MCG/ML) IV SYRINGE
PREFILLED_SYRINGE | INTRAVENOUS | Status: AC
  Filled 2020-03-02: qty 5

## 2020-03-02 MED ORDER — DROPERIDOL 2.5 MG/ML IJ SOLN
0.6250 mg | Freq: Once | INTRAMUSCULAR | Status: DC | PRN
  Filled 2020-03-02: qty 2

## 2020-03-02 MED ORDER — CHLORHEXIDINE GLUCONATE 0.12 % MT SOLN
15.0000 mL | Freq: Once | OROMUCOSAL | Status: AC
  Administered 2020-03-02: 15 mL via OROMUCOSAL

## 2020-03-02 MED ORDER — BUPIVACAINE HCL (PF) 0.5 % IJ SOLN
INTRAMUSCULAR | Status: AC
  Filled 2020-03-02: qty 30

## 2020-03-02 MED ORDER — PROMETHAZINE HCL 25 MG/ML IJ SOLN: 6.2500 mg | INTRAMUSCULAR | Status: DC | PRN

## 2020-03-02 MED ORDER — BUPIVACAINE HCL (PF) 0.5 % IJ SOLN
INTRAMUSCULAR | Status: DC | PRN
  Administered 2020-03-02: 10 mL

## 2020-03-02 MED ORDER — CHLORHEXIDINE GLUCONATE 0.12 % MT SOLN
OROMUCOSAL | Status: AC
  Filled 2020-03-02: qty 15

## 2020-03-02 MED ORDER — FENTANYL CITRATE (PF) 100 MCG/2ML IJ SOLN
INTRAMUSCULAR | Status: DC | PRN
  Administered 2020-03-02 (×2): 25 ug via INTRAVENOUS

## 2020-03-02 MED ORDER — OXYCODONE HCL 5 MG PO TABS: 5.0000 mg | ORAL_TABLET | Freq: Once | ORAL | Status: DC | PRN

## 2020-03-02 MED ORDER — OXYCODONE HCL 5 MG/5ML PO SOLN: 5.0000 mg | Freq: Once | ORAL | Status: DC | PRN

## 2020-03-02 MED ORDER — PROPOFOL 10 MG/ML IV BOLUS
INTRAVENOUS | Status: DC | PRN
  Administered 2020-03-02: 20 mg via INTRAVENOUS
  Administered 2020-03-02: 30 mg via INTRAVENOUS

## 2020-03-02 MED ORDER — LACTATED RINGERS IV SOLN: INTRAVENOUS | Status: DC

## 2020-03-02 MED ORDER — PHENYLEPHRINE HCL (PRESSORS) 10 MG/ML IV SOLN
INTRAVENOUS | Status: DC | PRN
  Administered 2020-03-02: 100 ug via INTRAVENOUS

## 2020-03-02 SURGICAL SUPPLY — 24 items
APL PRP STRL LF DISP 70% ISPRP (MISCELLANEOUS) ×1
BNDG COHESIVE 1X5 TAN NS LF (GAUZE/BANDAGES/DRESSINGS) ×3 IMPLANT
BNDG CONFORM 2 STRL LF (GAUZE/BANDAGES/DRESSINGS) ×3 IMPLANT
BNDG ESMARK 4X12 TAN STRL LF (GAUZE/BANDAGES/DRESSINGS) ×3 IMPLANT
CHLORAPREP W/TINT 26 (MISCELLANEOUS) ×3 IMPLANT
CORD BIP STRL DISP 12FT (MISCELLANEOUS) ×3 IMPLANT
COVER WAND RF STERILE (DRAPES) ×3 IMPLANT
DRAPE SURG 17X11 SM STRL (DRAPES) ×6 IMPLANT
FORCEPS JEWEL BIP 4-3/4 STR (INSTRUMENTS) ×3 IMPLANT
GAUZE SPONGE 4X4 12PLY STRL (GAUZE/BANDAGES/DRESSINGS) ×3 IMPLANT
GAUZE XEROFORM 1X8 LF (GAUZE/BANDAGES/DRESSINGS) ×3 IMPLANT
GLOVE BIO SURGEON STRL SZ8 (GLOVE) ×6 IMPLANT
GLOVE INDICATOR 8.0 STRL GRN (GLOVE) ×3 IMPLANT
GOWN STRL REUS W/ TWL LRG LVL3 (GOWN DISPOSABLE) ×1 IMPLANT
GOWN STRL REUS W/ TWL XL LVL3 (GOWN DISPOSABLE) ×1 IMPLANT
GOWN STRL REUS W/TWL LRG LVL3 (GOWN DISPOSABLE) ×3
GOWN STRL REUS W/TWL XL LVL3 (GOWN DISPOSABLE) ×3
KIT TURNOVER KIT A (KITS) ×3 IMPLANT
MANIFOLD NEPTUNE II (INSTRUMENTS) ×3 IMPLANT
NEEDLE HYPO 25X1 1.5 SAFETY (NEEDLE) ×3 IMPLANT
NS IRRIG 500ML POUR BTL (IV SOLUTION) ×3 IMPLANT
PACK EXTREMITY (MISCELLANEOUS) ×3 IMPLANT
STOCKINETTE IMPERVIOUS 9X36 MD (GAUZE/BANDAGES/DRESSINGS) ×3 IMPLANT
SUT PROLENE 4 0 PS 2 18 (SUTURE) ×3 IMPLANT

## 2020-03-02 NOTE — Transfer of Care (Signed)
Immediate Anesthesia Transfer of Care Note  Patient: Danielle Armstrong  Procedure(s) Performed: RELEASE TRIGGER FINGER/A-1 PULLEY (Right Middle Finger)  Patient Location: PACU  Anesthesia Type:General  Level of Consciousness: sedated  Airway & Oxygen Therapy: Patient Spontanous Breathing and Patient connected to face mask oxygen  Post-op Assessment: Report given to RN and Post -op Vital signs reviewed and stable  Post vital signs: Reviewed and stable  Last Vitals:  Vitals Value Taken Time  BP 93/56 03/02/20 1217  Temp 36 C 03/02/20 1215  Pulse 57 03/02/20 1218  Resp 13 03/02/20 1218  SpO2 98 % 03/02/20 1218  Vitals shown include unvalidated device data.  Last Pain:  Vitals:   03/02/20 1034  TempSrc: Oral  PainSc: 0-No pain         Complications: No complications documented.

## 2020-03-02 NOTE — Anesthesia Procedure Notes (Signed)
Procedure Name: MAC Date/Time: 03/02/2020 11:33 AM Performed by: Jerrye Noble, CRNA Pre-anesthesia Checklist: Patient identified, Emergency Drugs available, Suction available and Patient being monitored Patient Re-evaluated:Patient Re-evaluated prior to induction Oxygen Delivery Method: Simple face mask

## 2020-03-02 NOTE — Op Note (Signed)
03/02/2020  12:10 PM  Patient:   Danielle Armstrong  Pre-Op Diagnosis:   Right long trigger finger.  Post-Op Diagnosis:   Same  Procedure:   Release right long trigger finger.  Surgeon:   Pascal Lux, MD  Assistant:   None  Anesthesia:   Local with IV sedation  Findings:   As above.  Complications:   None  EBL:   0 cc  Fluids:   600 cc crystalloid  TT:   16 minutes at 250 mmHg  Drains:   None  Closure:   4-0 Prolene  Implants:   None  Brief Clinical Note:   The patient is a 74 year old female with a history of progressively worsening painful catching of her right long finger. These symptoms have progressed despite medications, activity modification, etc. The patient's history and examination were consistent with a right long trigger finger. The patient presents at this time for a right long trigger finger release.  Procedure:   The patient was brought into the operating room and lain in the supine position. After adequate IV sedation was achieved, the right hand and upper extremity were prepped with ChloraPrep solution before being draped sterilely. Preoperative antibiotics were administered.  A timeout was performed to verify the appropriate surgical site before the limb was exsanguinated and the tourniquet inflated to 250 mmHg.  5 cc of 0.5% plain Sensorcaine was injected in and around the expected surgical incision to help with perioperative analgesia.  An approximately 1.5-2.0 cm obliquely oriented incision was made over the volar aspect of the right long finger at the level of the metacarpal head centered over the flexor sheath. The incision was carried down through the subcutaneous tissues with care taken to identify and protect the digital neurovascular structures. The flexor sheath was entered just proximal to the A1 pulley. The sheath was released proximally for several centimeters under direct visualization. Distally, a clamp was placed beneath the A1 pulley and used  to release any adhesions. The clamp was repositioned so that one jaw was superficial to and the other jaw deep to the A1 pulley. The A1 pulley was incised on either side of the clamp to remove a 2 mm strip of tissue. Metzenbaum scissors were used to ensure complete release of the A1 pulley more distally. The underlying tendons were carefully inspected and found to be intact.   The wound was copiously irrigated with sterile saline solution before the wound was closed using 4-0 Prolene interrupted sutures. An additional 5 cc (for a total of 10 cc) of 0.5% plain Sensorcaine was injected in and around the incision to help with postoperative analgesia before a sterile bulky dressing was applied to the hand. The patient was then awakened and returned to the recovery room in satisfactory condition after tolerating the procedure well.

## 2020-03-02 NOTE — Anesthesia Preprocedure Evaluation (Signed)
Anesthesia Evaluation  Patient identified by MRN, date of birth, ID band Patient awake    Reviewed: Allergy & Precautions, NPO status , Patient's Chart, lab work & pertinent test results  History of Anesthesia Complications (+) PONV and history of anesthetic complications  Airway Mallampati: II       Dental  (+) Edentulous Upper, Edentulous Lower   Pulmonary neg pulmonary ROS, former smoker,    Pulmonary exam normal breath sounds clear to auscultation       Cardiovascular hypertension, negative cardio ROS Normal cardiovascular exam Rhythm:Regular Rate:Normal     Neuro/Psych  Headaches, Seizures -,  negative psych ROS   GI/Hepatic Neg liver ROS, GERD  ,  Endo/Other  negative endocrine ROS  Renal/GU negative Renal ROS  negative genitourinary   Musculoskeletal  (+) Arthritis ,   Abdominal   Peds negative pediatric ROS (+)  Hematology negative hematology ROS (+)   Anesthesia Other Findings   Reproductive/Obstetrics negative OB ROS                             Anesthesia Physical Anesthesia Plan  ASA: II  Anesthesia Plan: General   Post-op Pain Management:    Induction: Intravenous  PONV Risk Score and Plan: 2 and Propofol infusion  Airway Management Planned: Nasal Cannula  Additional Equipment: None  Intra-op Plan:   Post-operative Plan:   Informed Consent: I have reviewed the patients History and Physical, chart, labs and discussed the procedure including the risks, benefits and alternatives for the proposed anesthesia with the patient or authorized representative who has indicated his/her understanding and acceptance.       Plan Discussed with: CRNA, Anesthesiologist and Surgeon  Anesthesia Plan Comments:         Anesthesia Quick Evaluation

## 2020-03-02 NOTE — Discharge Instructions (Addendum)
Orthopedic discharge instructions: Keep dressing dry and intact. Keep hand elevated above heart level. May shower after dressing removed on postop day 4 (Sunday). Cover sutures with Band-Aids after drying off. Apply ice to affected area frequently. Take ibuprofen 600-800 mg TID with meals for 7-10 days, then as necessary. Take ES Tylenol when needed.  Return for follow-up in 10-14 days or as scheduled.    AMBULATORY SURGERY  DISCHARGE INSTRUCTIONS   1) The drugs that you were given will stay in your system until tomorrow so for the next 24 hours you should not:  A) Drive an automobile B) Make any legal decisions C) Drink any alcoholic beverage   2) You may resume regular meals tomorrow.  Today it is better to start with liquids and gradually work up to solid foods.  You may eat anything you prefer, but it is better to start with liquids, then soup and crackers, and gradually work up to solid foods.   3) Please notify your doctor immediately if you have any unusual bleeding, trouble breathing, redness and pain at the surgery site, drainage, fever, or pain not relieved by medication.  4) Your post-operative visit with Dr.                                     is: Date:                        Time:    Please call to schedule your post-operative visit.  5) Additional Instructions:

## 2020-03-02 NOTE — Anesthesia Postprocedure Evaluation (Signed)
Anesthesia Post Note  Patient: Danielle Armstrong  Procedure(s) Performed: RELEASE TRIGGER FINGER/A-1 PULLEY (Right Middle Finger)  Patient location during evaluation: PACU Anesthesia Type: General Level of consciousness: awake Pain management: pain level controlled Vital Signs Assessment: post-procedure vital signs reviewed and stable Respiratory status: spontaneous breathing Cardiovascular status: blood pressure returned to baseline Postop Assessment: no apparent nausea or vomiting Anesthetic complications: no   No complications documented.   Last Vitals:  Vitals:   03/02/20 1217 03/02/20 1223  BP: (!) 93/56 (!) 101/55  Pulse: (!) 57 (!) 57  Resp: 12 13  Temp:    SpO2: 97% 98%    Last Pain:  Vitals:   03/02/20 1223  TempSrc:   PainSc: 0-No pain                 Neva Seat

## 2020-03-02 NOTE — H&P (Signed)
History of Present Illness:  Danielle Armstrong is a 74 y.o. female who presents for follow-up of her right ring trigger finger. The patient was last seen for these symptoms nearly 6 months ago. At this visit, she received a steroid injection which she states provided moderate relief of her symptoms lasting 4 to 5 months before her symptoms began to recur. She again notes moderate to severe pain and catching in the finger associated with some swelling. She rates her pain at 3/10, but is not taking any medications for discomfort at this time. She denies any recent reinjury to the hand, and denies any numbness or tingling to her fingers.  Current Outpatient Medications: . alpha lipoic acid 200 mg Tab Take 600 mg by mouth once daily.  Marland Kitchen aspirin 81 MG EC tablet Take 81 mg by mouth once daily  . CHLORPHENIRAMINE MALEATE (CHLORTABS ORAL) Take 1 tablet by mouth as needed  . GINKGO BILOBA EXTRACT ORAL Take 120 mg by mouth once daily  . Herbal Supplement Herbal Name: juice plus  . ketotifen (ZADITOR) 0.025 % (0.035 %) ophthalmic solution Apply to eye  . metoprolol tartrate (LOPRESSOR) 25 MG tablet TAKE 1 TABLET TWICE DAILY 180 tablet 3  . pantoprazole (PROTONIX) 40 MG DR tablet Take 1 tablet (40 mg total) by mouth 2 (two) times daily before meals 180 tablet 3  . REDNESS RELIEVER EYE DROPS 0.05 % ophthalmic solution Place 1 drop into both eyes as needed   Allergies: No Known Allergies  Past Medical History:  . Allergic state  . Arthritis of right glenohumeral joint  . Barrett esophagus  . Breast cyst  . Cancer of cervix (CMS-HCC)  . Chicken pox  . Depression  . Epilepsy (CMS-HCC)  . GERD (gastroesophageal reflux disease)  . Glaucoma (increased eye pressure)  . H/O colonoscopy 05/2013  . Hemorrhoids  . Hyperlipidemia  . Hypertension  . Migraines  . Neuropathy  a couple of years - getting worse  . Osteoarthritis  . Osteoporosis, post-menopausal  . Pleurisy  . Seizures (CMS-HCC)  . Umbilical  hernia   Past Surgical History:  . Arthroscopic partial medial meniscectomy plus debridement and coblation of medial femoral chondral lesion Left 08/13/2014  Dr.[facility]  . CARPAL TUNNEL RELEASE  . COLONOSCOPY 05/26/2013  . Cyst removed from breast  . Cyst Removed from under Right wrist  . EXCISION PILONIDAL CYST/SINUS  . EXTENSIVE ARTHROSCOPIC DEBRIDEMENT, ARTHROSCOPIC SUBACROMIAL DECOMPRESSION, AND MINI OPEN BICEPS TENODESIS , RIGHT SHOULDER Right 06/26/2018 Cydni Reddoch  . Hemorrhoid surgery  . HYSTERECTOMY VAGINAL  . OOPHORECTOMY  complete hyst. in 1992  . oral teeth extraction  . pneumovax 03/2013  . Right carpal tunnel  . Right Hand Tendon Removed  . Tdap 03/2013  . TONSILLECTOMY  . zostavax 03/2011   Family History:  . Breast cancer Mother  . Arthritis Sister  . Rheum arthritis Son  . Epilepsy Other (father's side of family)  . Osteoporosis (Thinning of bones) Other (mother's side of family)  . Stroke Other   Social History:   Socioeconomic History:  Marland Kitchen Marital status: Divorced  Spouse name: Not on file  . Number of children: Not on file  . Years of education: Not on file  . Highest education level: Not on file  Occupational History  . Not on file  Tobacco Use  . Smoking status: Former Smoker  Types: Cigarettes  Quit date: 04/24/1963  Years since quitting: 56.8  . Smokeless tobacco: Never Used  . Tobacco comment: Smoked lightly on  and off for a couple decades  Vaping Use  . Vaping Use: Never used  Substance and Sexual Activity  . Alcohol use: No  Alcohol/week: 0.0 standard drinks  . Drug use: No  . Sexual activity: Not Currently  Other Topics Concern  . Not on file  Social History Narrative  . Not on file   Social Determinants of Health:   Financial Resource Strain:  . Difficulty of Paying Living Expenses:  Food Insecurity:  . Worried About Charity fundraiser in the Last Year:  . Arboriculturist in the Last Year:  Transportation Needs:  . Lexicographer (Medical):  Marland Kitchen Lack of Transportation (Non-Medical):   Review of Systems:  A comprehensive 14 point ROS was performed, reviewed, and the pertinent orthopaedic findings are documented in the HPI.  Physical Exam: Vitals:  01/29/20 1124  BP: 132/80  Weight: 79.4 kg (175 lb)  Height: 162.6 cm (5' 4.02")  PainSc: 3  PainLoc: Finger   General/Constitutional: The patient appears to be well-nourished, well-developed, and in no acute distress. Neuro/Psych: Normal mood and affect, oriented to person, place and time.  Eyes: Non-icteric. Pupils are equal, round, and reactive to light, and exhibit synchronous movement. ENT: Unremarkable. Lymphatic: No palpable adenopathy. Respiratory: Lungs clear to auscultation, Normal chest excursion, No wheezes and Non-labored breathing Cardiovascular: Regular rate and rhythm. No murmurs. and No edema, swelling or tenderness, except as noted in detailed exam. Integumentary: No impressive skin lesions present, except as noted in detailed exam. Musculoskeletal: Unremarkable, except as noted in detailed exam.  Right hand exam:  Skin inspection of the right hand again again is notable for focal swelling along the right long finger in the area of the PIP joint, but otherwise remains unremarkable. No erythema, ecchymosis, abrasions, or other skin abnormalities are identified. She experiences moderate focal tenderness to palpation along the volar aspect of her long finger, especially in the area of the MCP joint. She has moderate pain with attempted active flexion and extension of the right long finger. Although no actual triggering is noted on today's visit, she does note that the finger will catch on her if she chooses to get it to flex fully. She has no tenderness to palpation around the PIP joint itself. She again is neurovascularlyintact to all digits of her right hand.  Assessment: Trigger middle finger of right hand.   Plan: The treatment options  were discussed with the patient. In addition, patient educational materials were provided regarding the diagnosis and treatment options. The patient is quite frustrated by her persistent symptoms and function limitations, and is ready to consider more aggressive treatment options. Therefore, I have recommended a surgical procedure, specifically a release of the right long trigger finger. The procedure was discussed with the patient, as were the potential risks (including bleeding, infection, nerve and/or blood vessel injury, persistent or recurrent pain, stiffness of the finger, recurrent triggering, need for further surgery, blood clots, strokes, heart attacks and/or arhythmias, pneumonia, etc.) and benefits. The patient states her understanding and wishes to proceed. All of the patient's questions and concerns were answered. She can call any time with further concerns. She will follow up post-surgery, routine.   H&P reviewed and patient re-examined. No changes.

## 2020-03-22 DIAGNOSIS — M7522 Bicipital tendinitis, left shoulder: Secondary | ICD-10-CM | POA: Diagnosis not present

## 2020-03-22 DIAGNOSIS — M19011 Primary osteoarthritis, right shoulder: Secondary | ICD-10-CM | POA: Diagnosis not present

## 2020-03-22 DIAGNOSIS — M25512 Pain in left shoulder: Secondary | ICD-10-CM | POA: Diagnosis not present

## 2020-03-22 DIAGNOSIS — M7582 Other shoulder lesions, left shoulder: Secondary | ICD-10-CM | POA: Diagnosis not present

## 2020-03-22 DIAGNOSIS — M25511 Pain in right shoulder: Secondary | ICD-10-CM | POA: Diagnosis not present

## 2020-03-22 DIAGNOSIS — G8929 Other chronic pain: Secondary | ICD-10-CM | POA: Diagnosis not present

## 2020-06-01 DIAGNOSIS — C44719 Basal cell carcinoma of skin of left lower limb, including hip: Secondary | ICD-10-CM | POA: Diagnosis not present

## 2020-06-01 DIAGNOSIS — L570 Actinic keratosis: Secondary | ICD-10-CM | POA: Diagnosis not present

## 2020-06-01 DIAGNOSIS — X32XXXA Exposure to sunlight, initial encounter: Secondary | ICD-10-CM | POA: Diagnosis not present

## 2020-06-01 DIAGNOSIS — D485 Neoplasm of uncertain behavior of skin: Secondary | ICD-10-CM | POA: Diagnosis not present

## 2020-06-01 DIAGNOSIS — D2262 Melanocytic nevi of left upper limb, including shoulder: Secondary | ICD-10-CM | POA: Diagnosis not present

## 2020-06-01 DIAGNOSIS — D2261 Melanocytic nevi of right upper limb, including shoulder: Secondary | ICD-10-CM | POA: Diagnosis not present

## 2020-06-01 DIAGNOSIS — D2272 Melanocytic nevi of left lower limb, including hip: Secondary | ICD-10-CM | POA: Diagnosis not present

## 2020-06-01 DIAGNOSIS — C44619 Basal cell carcinoma of skin of left upper limb, including shoulder: Secondary | ICD-10-CM | POA: Diagnosis not present

## 2020-06-01 DIAGNOSIS — L408 Other psoriasis: Secondary | ICD-10-CM | POA: Diagnosis not present

## 2020-06-06 ENCOUNTER — Other Ambulatory Visit: Payer: Self-pay | Admitting: Family Medicine

## 2020-06-06 DIAGNOSIS — Z1231 Encounter for screening mammogram for malignant neoplasm of breast: Secondary | ICD-10-CM

## 2020-06-22 DIAGNOSIS — N644 Mastodynia: Secondary | ICD-10-CM | POA: Diagnosis not present

## 2020-06-22 DIAGNOSIS — I10 Essential (primary) hypertension: Secondary | ICD-10-CM | POA: Diagnosis not present

## 2020-06-22 DIAGNOSIS — R0789 Other chest pain: Secondary | ICD-10-CM | POA: Diagnosis not present

## 2020-06-28 ENCOUNTER — Other Ambulatory Visit: Payer: Self-pay | Admitting: Family Medicine

## 2020-06-28 DIAGNOSIS — N644 Mastodynia: Secondary | ICD-10-CM

## 2020-06-29 ENCOUNTER — Other Ambulatory Visit: Payer: Self-pay | Admitting: Family Medicine

## 2020-06-29 DIAGNOSIS — N644 Mastodynia: Secondary | ICD-10-CM

## 2020-07-06 ENCOUNTER — Ambulatory Visit
Admission: RE | Admit: 2020-07-06 | Discharge: 2020-07-06 | Disposition: A | Discharge status: Home / Self Care | Payer: Medicare HMO | Source: Ambulatory Visit | Attending: Family Medicine | Admitting: Family Medicine

## 2020-07-06 ENCOUNTER — Other Ambulatory Visit: Payer: Self-pay

## 2020-07-06 DIAGNOSIS — N644 Mastodynia: Secondary | ICD-10-CM | POA: Diagnosis not present

## 2020-07-06 DIAGNOSIS — R922 Inconclusive mammogram: Secondary | ICD-10-CM | POA: Diagnosis not present

## 2020-07-12 DIAGNOSIS — C44719 Basal cell carcinoma of skin of left lower limb, including hip: Secondary | ICD-10-CM | POA: Diagnosis not present

## 2020-07-26 DIAGNOSIS — C44619 Basal cell carcinoma of skin of left upper limb, including shoulder: Secondary | ICD-10-CM | POA: Diagnosis not present

## 2020-07-27 DIAGNOSIS — I10 Essential (primary) hypertension: Secondary | ICD-10-CM | POA: Diagnosis not present

## 2020-07-27 DIAGNOSIS — R8290 Unspecified abnormal findings in urine: Secondary | ICD-10-CM | POA: Diagnosis not present

## 2020-08-03 DIAGNOSIS — I10 Essential (primary) hypertension: Secondary | ICD-10-CM | POA: Diagnosis not present

## 2020-08-03 DIAGNOSIS — E785 Hyperlipidemia, unspecified: Secondary | ICD-10-CM | POA: Diagnosis not present

## 2020-08-03 DIAGNOSIS — R0789 Other chest pain: Secondary | ICD-10-CM | POA: Diagnosis not present

## 2020-08-03 DIAGNOSIS — Z0000 Encounter for general adult medical examination without abnormal findings: Secondary | ICD-10-CM | POA: Diagnosis not present

## 2020-11-29 DIAGNOSIS — M545 Low back pain, unspecified: Secondary | ICD-10-CM | POA: Diagnosis not present

## 2020-12-14 DIAGNOSIS — Z85828 Personal history of other malignant neoplasm of skin: Secondary | ICD-10-CM | POA: Diagnosis not present

## 2020-12-14 DIAGNOSIS — D2261 Melanocytic nevi of right upper limb, including shoulder: Secondary | ICD-10-CM | POA: Diagnosis not present

## 2020-12-14 DIAGNOSIS — D225 Melanocytic nevi of trunk: Secondary | ICD-10-CM | POA: Diagnosis not present

## 2020-12-14 DIAGNOSIS — L570 Actinic keratosis: Secondary | ICD-10-CM | POA: Diagnosis not present

## 2020-12-14 DIAGNOSIS — X32XXXA Exposure to sunlight, initial encounter: Secondary | ICD-10-CM | POA: Diagnosis not present

## 2020-12-14 DIAGNOSIS — D2272 Melanocytic nevi of left lower limb, including hip: Secondary | ICD-10-CM | POA: Diagnosis not present

## 2020-12-14 DIAGNOSIS — L538 Other specified erythematous conditions: Secondary | ICD-10-CM | POA: Diagnosis not present

## 2020-12-14 DIAGNOSIS — L821 Other seborrheic keratosis: Secondary | ICD-10-CM | POA: Diagnosis not present

## 2020-12-14 DIAGNOSIS — L820 Inflamed seborrheic keratosis: Secondary | ICD-10-CM | POA: Diagnosis not present

## 2020-12-22 HISTORY — PX: OTHER SURGICAL HISTORY: SHX169

## 2021-02-21 DIAGNOSIS — Z0100 Encounter for examination of eyes and vision without abnormal findings: Secondary | ICD-10-CM | POA: Diagnosis not present

## 2021-02-21 DIAGNOSIS — H2513 Age-related nuclear cataract, bilateral: Secondary | ICD-10-CM | POA: Diagnosis not present

## 2021-02-21 DIAGNOSIS — H35372 Puckering of macula, left eye: Secondary | ICD-10-CM | POA: Diagnosis not present

## 2021-03-07 ENCOUNTER — Encounter: Payer: Self-pay | Admitting: Ophthalmology

## 2021-03-07 DIAGNOSIS — H2512 Age-related nuclear cataract, left eye: Secondary | ICD-10-CM | POA: Diagnosis not present

## 2021-03-22 ENCOUNTER — Ambulatory Visit: Payer: Medicare HMO | Admitting: Anesthesiology

## 2021-03-22 ENCOUNTER — Other Ambulatory Visit: Payer: Self-pay

## 2021-03-22 ENCOUNTER — Encounter: Payer: Self-pay | Admitting: Ophthalmology

## 2021-03-22 ENCOUNTER — Encounter
Admission: RE | Disposition: A | Discharge status: Home / Self Care | Payer: Self-pay | Source: Home / Self Care | Attending: Ophthalmology

## 2021-03-22 ENCOUNTER — Ambulatory Visit
Admission: RE | Admit: 2021-03-22 | Discharge: 2021-03-22 | Disposition: A | Discharge status: Home / Self Care | Payer: Medicare HMO | Attending: Ophthalmology | Admitting: Ophthalmology

## 2021-03-22 DIAGNOSIS — Z87891 Personal history of nicotine dependence: Secondary | ICD-10-CM | POA: Diagnosis not present

## 2021-03-22 DIAGNOSIS — I10 Essential (primary) hypertension: Secondary | ICD-10-CM | POA: Diagnosis not present

## 2021-03-22 DIAGNOSIS — H25812 Combined forms of age-related cataract, left eye: Secondary | ICD-10-CM | POA: Diagnosis not present

## 2021-03-22 DIAGNOSIS — H2512 Age-related nuclear cataract, left eye: Secondary | ICD-10-CM | POA: Insufficient documentation

## 2021-03-22 HISTORY — DX: Cardiac arrhythmia, unspecified: I49.9

## 2021-03-22 HISTORY — PX: CATARACT EXTRACTION W/PHACO: SHX586

## 2021-03-22 SURGERY — PHACOEMULSIFICATION, CATARACT, WITH IOL INSERTION
Anesthesia: Monitor Anesthesia Care | Site: Eye | Laterality: Left

## 2021-03-22 MED ORDER — LACTATED RINGERS IV SOLN: INTRAVENOUS | Status: DC

## 2021-03-22 MED ORDER — ACETAMINOPHEN 500 MG PO TABS: 1000.0000 mg | ORAL_TABLET | Freq: Once | ORAL | Status: DC | PRN

## 2021-03-22 MED ORDER — TETRACAINE HCL 0.5 % OP SOLN
1.0000 [drp] | OPHTHALMIC | Status: AC | PRN
  Administered 2021-03-22 (×3): 1 [drp] via OPHTHALMIC

## 2021-03-22 MED ORDER — ONDANSETRON HCL 4 MG/2ML IJ SOLN: 4.0000 mg | Freq: Once | INTRAMUSCULAR | Status: DC | PRN

## 2021-03-22 MED ORDER — SIGHTPATH DOSE#1 NA HYALUR & NA CHOND-NA HYALUR IO KIT
PACK | INTRAOCULAR | Status: DC | PRN
  Administered 2021-03-22: 1 via OPHTHALMIC

## 2021-03-22 MED ORDER — BRIMONIDINE TARTRATE-TIMOLOL 0.2-0.5 % OP SOLN
OPHTHALMIC | Status: DC | PRN
  Administered 2021-03-22: 1 [drp] via OPHTHALMIC

## 2021-03-22 MED ORDER — ACETAMINOPHEN 160 MG/5ML PO SOLN: 975.0000 mg | Freq: Once | ORAL | Status: DC | PRN

## 2021-03-22 MED ORDER — ARMC OPHTHALMIC DILATING DROPS
1.0000 "application " | OPHTHALMIC | Status: AC | PRN
  Administered 2021-03-22 (×3): 1 via OPHTHALMIC

## 2021-03-22 MED ORDER — SIGHTPATH DOSE#1 BSS IO SOLN
INTRAOCULAR | Status: DC | PRN
  Administered 2021-03-22: 15 mL via INTRAOCULAR

## 2021-03-22 MED ORDER — SIGHTPATH DOSE#1 BSS IO SOLN
INTRAOCULAR | Status: DC | PRN
  Administered 2021-03-22: 52 mL via OPHTHALMIC

## 2021-03-22 MED ORDER — FENTANYL CITRATE (PF) 100 MCG/2ML IJ SOLN
INTRAMUSCULAR | Status: DC | PRN
  Administered 2021-03-22: 50 ug via INTRAVENOUS

## 2021-03-22 MED ORDER — MIDAZOLAM HCL 2 MG/2ML IJ SOLN
INTRAMUSCULAR | Status: DC | PRN
  Administered 2021-03-22: 1 mg via INTRAVENOUS

## 2021-03-22 MED ORDER — CEFUROXIME OPHTHALMIC INJECTION 1 MG/0.1 ML
INJECTION | OPHTHALMIC | Status: DC | PRN
  Administered 2021-03-22: 0.1 mL via INTRACAMERAL

## 2021-03-22 MED ORDER — SIGHTPATH DOSE#1 BSS IO SOLN
INTRAOCULAR | Status: DC | PRN
  Administered 2021-03-22: 2 mL

## 2021-03-22 SURGICAL SUPPLY — 16 items
CANNULA ANT/CHMB 27GA (MISCELLANEOUS) ×2 IMPLANT
GLOVE SRG 8 PF TXTR STRL LF DI (GLOVE) ×1 IMPLANT
GLOVE SURG ENC TEXT LTX SZ7.5 (GLOVE) ×2 IMPLANT
GLOVE SURG UNDER POLY LF SZ8 (GLOVE) ×2
GOWN STRL REUS W/ TWL LRG LVL3 (GOWN DISPOSABLE) ×2 IMPLANT
GOWN STRL REUS W/TWL LRG LVL3 (GOWN DISPOSABLE) ×4
LENS IOL DIOP 22.5 (Intraocular Lens) ×2 IMPLANT
LENS IOL TECNIS MONO 22.5 (Intraocular Lens) ×1 IMPLANT
MARKER SKIN DUAL TIP RULER LAB (MISCELLANEOUS) ×2 IMPLANT
NEEDLE FILTER BLUNT 18X 1/2SAF (NEEDLE) ×2
NEEDLE FILTER BLUNT 18X1 1/2 (NEEDLE) ×2 IMPLANT
PACK EYE AFTER SURG (MISCELLANEOUS) ×2 IMPLANT
SYR 3ML LL SCALE MARK (SYRINGE) ×4 IMPLANT
SYR TB 1ML LUER SLIP (SYRINGE) ×2 IMPLANT
WATER STERILE IRR 250ML POUR (IV SOLUTION) ×2 IMPLANT
WIPE NON LINTING 3.25X3.25 (MISCELLANEOUS) ×2 IMPLANT

## 2021-03-22 NOTE — Op Note (Signed)
OPERATIVE NOTE  RAKHI ROMAGNOLI [id number] 03/22/2021   PREOPERATIVE DIAGNOSIS:  Nuclear sclerotic cataract left eye. H25.12   POSTOPERATIVE DIAGNOSIS:    Nuclear sclerotic cataract left eye.     PROCEDURE:  Phacoemusification with posterior chamber intraocular lens placement of the left eye  Ultrasound time: Procedure(s): CATARACT EXTRACTION PHACO AND INTRAOCULAR LENS PLACEMENT (IOC) LEFT 8.57 00:55.7 (Left)  LENS:   Implant Name Type Inv. Item Serial No. Manufacturer Lot No. LRB No. Used Action  LENS IOL DIOP 22.5 - [id number] Intraocular Lens LENS IOL DIOP 22.5 [id number] JOHNSON   Left 1 Implanted      SURGEON:  Wyonia Hough, MD   ANESTHESIA:  Topical with tetracaine drops and 2% Xylocaine jelly, augmented with 1% preservative-free intracameral lidocaine.    COMPLICATIONS:  None.   DESCRIPTION OF PROCEDURE:  The patient was identified in the holding room and transported to the operating room and placed in the supine position under the operating microscope.  The left eye was identified as the operative eye and it was prepped and draped in the usual sterile ophthalmic fashion.   A 1 millimeter clear-corneal paracentesis was made at the 1:30 position.  0.5 ml of preservative-free 1% lidocaine was injected into the anterior chamber.  The anterior chamber was filled with Viscoat viscoelastic.  A 2.4 millimeter keratome was used to make a near-clear corneal incision at the 10:30 position.  .  A curvilinear capsulorrhexis was made with a cystotome and capsulorrhexis forceps.  Balanced salt solution was used to hydrodissect and hydrodelineate the nucleus.   Phacoemulsification was then used in stop and chop fashion to remove the lens nucleus and epinucleus.  The remaining cortex was then removed using the irrigation and aspiration handpiece. Provisc was then placed into the capsular bag to distend it for lens placement.  A lens was then injected into the capsular bag.  The  remaining viscoelastic was aspirated.   Wounds were hydrated with balanced salt solution.  The anterior chamber was inflated to a physiologic pressure with balanced salt solution.  No wound leaks were noted. Cefuroxime 0.1 ml of a 10mg /ml solution was injected into the anterior chamber for a dose of 1 mg of intracameral antibiotic at the completion of the case.   Timolol and Brimonidine drops were applied to the eye.  The patient was taken to the recovery room in stable condition without complications of anesthesia or surgery.  Antavion Bartoszek 03/22/2021, 12:32 PM

## 2021-03-22 NOTE — Anesthesia Procedure Notes (Signed)
Procedure Name: MAC Date/Time: 03/22/2021 12:17 PM Performed by: Cameron Ali, CRNA Pre-anesthesia Checklist: Patient identified, Emergency Drugs available, Suction available, Timeout performed and Patient being monitored Patient Re-evaluated:Patient Re-evaluated prior to induction Oxygen Delivery Method: Nasal cannula Placement Confirmation: positive ETCO2

## 2021-03-22 NOTE — Anesthesia Postprocedure Evaluation (Signed)
Anesthesia Post Note  Patient: Danielle Armstrong  Procedure(s) Performed: CATARACT EXTRACTION PHACO AND INTRAOCULAR LENS PLACEMENT (IOC) LEFT 8.57 00:55.7 (Left: Eye)     Patient location during evaluation: PACU Anesthesia Type: MAC Level of consciousness: awake and alert Pain management: pain level controlled Vital Signs Assessment: post-procedure vital signs reviewed and stable Respiratory status: spontaneous breathing, nonlabored ventilation and respiratory function stable Cardiovascular status: stable and blood pressure returned to baseline Postop Assessment: no apparent nausea or vomiting Anesthetic complications: no   No notable events documented.  April Manson

## 2021-03-22 NOTE — H&P (Signed)
[facility]   Primary Care Physician:  Maryland Pink, MD Ophthalmologist: Dr. Leandrew Koyanagi  Pre-Procedure History & Physical: HPI:  Danielle Armstrong is a 75 y.o. female here for ophthalmic surgery.   Past Medical History:  Diagnosis Date   Barretts esophagus    Cancer ([place]) 1992   cervical   Epilepsy ([place])    nonoe in years   GERD (gastroesophageal reflux disease)    Glaucoma    HLD (hyperlipidemia)    HTN (hypertension)    Irregular heart rate    per patient   Migraines    Neuropathy    Osteoarthritis    Osteoporosis    PONV (postoperative nausea and vomiting)    hospitalized x 1 wk unable to keep anything down orally 1992 AFTER HYSTERECTOMY   Seizures ([place])    none in years   Umbilical hernia     Past Surgical History:  Procedure Laterality Date   ABDOMINAL HYSTERECTOMY     w/oophorectomy   BREAST EXCISIONAL BIOPSY Right 1987   neg   CARPAL TUNNEL RELEASE Right    COLONOSCOPY     dental extractions     HEMORRHOID SURGERY     KNEE ARTHROSCOPY Left    meniscus repair   PILONIDAL CYST / SINUS EXCISION     SHOULDER ARTHROSCOPY WITH SUBACROMIAL DECOMPRESSION AND BICEP TENDON REPAIR Right 06/26/2018   Procedure: SHOULDER ARTHROSCOPY WITH DEBRIDEMENT, DECOMPRESSION, POSSIBLE ROTATOR CUFF REPAIR AND BICEP TENODESIS-RIGHT;  Surgeon: Corky Mull, MD;  Location: [facility];  Service: Orthopedics;  Laterality: Right;   TENDON REPAIR     right hand   TONSILLECTOMY     TRIGGER FINGER RELEASE Right 03/02/2020   Procedure: RELEASE TRIGGER FINGER/A-1 PULLEY;  Surgeon: Corky Mull, MD;  Location: [facility];  Service: Orthopedics;  Laterality: Right;    Prior to Admission medications   Medication Sig Start Date End Date Taking? Authorizing Provider  Alpha Lipoic Acid 200 MG CAPS Take 200 mg by mouth 2 (two) times daily.   Yes [provider]  cetirizine (ZYRTEC) 10 MG tablet Take 10 mg by mouth every morning.    Yes [provider]   chlorpheniramine (CHLOR-TRIMETON) 4 MG tablet Take 4 mg by mouth daily as needed for allergies.   Yes [provider]  Ginkgo Biloba 60 MG TABS Take 60 mg by mouth 2 (two) times daily.   Yes [provider]  Inulin (FIBER CHOICE PO) Take 1 tablet by mouth at bedtime. Gummies   Yes [provider]  ketotifen (ZADITOR) 0.025 % ophthalmic solution Place 1 drop into both eyes 2 (two) times daily as needed (irritated eyes.).   Yes [provider]  metoprolol tartrate (LOPRESSOR) 25 MG tablet Take 25 mg by mouth 2 (two) times daily. 04/22/18  Yes [provider]  Misc Natural Products (NEURIVA) CHEW Chew 2 tablets by mouth at bedtime and may repeat dose one time if needed.   Yes [provider]  Nutritional Supplements (FRUIT & VEGETABLE DAILY PO) Take 3 capsules by mouth every evening. Juice Plus Fruit + Berry + Vegetable (3 capsule total daily)   Yes [provider]  OVER THE COUNTER MEDICATION Take 2 tablets by mouth daily in the afternoon. Herpesyl Supplement   Yes [provider]  pantoprazole (PROTONIX) 40 MG tablet Take 40 mg by mouth in the morning and at bedtime. 12/07/19  Yes [provider]    Allergies as of 02/23/2021   (No Known Allergies)  Family History  Problem Relation Age of Onset   Breast cancer Mother 60    Social History   Socioeconomic History   Marital status: Divorced    Spouse name: Not on file   Number of children: Not on file   Years of education: Not on file   Highest education level: Not on file  Occupational History   Not on file  Tobacco Use   Smoking status: Former    Packs/day: 0.25    Years: 3.00    Pack years: 0.75    Types: Cigarettes    Quit date: 66    Years since quitting: 32.9   Smokeless tobacco: Never  Vaping Use   Vaping Use: Never used  Substance and Sexual Activity   Alcohol use: Never   Drug use: Never   Sexual activity: Not on file  Other Topics  Concern   Not on file  Social History Narrative   Not on file   Social Determinants of Health   Financial Resource Strain: Not on file  Food Insecurity: Not on file  Transportation Needs: Not on file  Physical Activity: Not on file  Stress: Not on file  Social Connections: Not on file  Intimate Partner Violence: Not on file    Review of Systems: See HPI, otherwise negative ROS  Physical Exam: BP (!) 175/88   Pulse 88   Temp 97.9 F (36.6 C) (Temporal)   Resp 18   Ht 5' 4.5" (1.638 m)   Wt 79.8 kg   SpO2 96%   BMI 29.74 kg/m  General:   Alert,  pleasant and cooperative in NAD Head:  Normocephalic and atraumatic. Lungs:  Clear to auscultation.    Heart:  Regular rate and rhythm.   Impression/Plan: Danielle Armstrong is here for ophthalmic surgery.  Risks, benefits, limitations, and alternatives regarding ophthalmic surgery have been reviewed with the patient.  Questions have been answered.  All parties agreeable.   Leandrew Koyanagi, MD  03/22/2021, 11:24 AM

## 2021-03-22 NOTE — Transfer of Care (Signed)
Immediate Anesthesia Transfer of Care Note  Patient: Danielle Armstrong  Procedure(s) Performed: CATARACT EXTRACTION PHACO AND INTRAOCULAR LENS PLACEMENT (IOC) LEFT 8.57 00:55.7 (Left: Eye)  Patient Location: PACU  Anesthesia Type: MAC  Level of Consciousness: awake, alert  and patient cooperative  Airway and Oxygen Therapy: Patient Spontanous Breathing and Patient connected to supplemental oxygen  Post-op Assessment: Post-op Vital signs reviewed, Patient's Cardiovascular Status Stable, Respiratory Function Stable, Patent Airway and No signs of Nausea or vomiting  Post-op Vital Signs: Reviewed and stable  Complications: No notable events documented.

## 2021-03-22 NOTE — Anesthesia Preprocedure Evaluation (Signed)
Anesthesia Evaluation  Patient identified by MRN, date of birth, ID band Patient awake    Reviewed: Allergy & Precautions, H&P , NPO status , Patient's Chart, lab work & pertinent test results, reviewed documented beta blocker date and time   History of Anesthesia Complications (+) PONV and history of anesthetic complications (PONV - 7342 hysterectomy couldn't keep anything down for a week)  Airway Mallampati: II  TM Distance: >3 FB Neck ROM: full    Dental no notable dental hx.    Pulmonary neg pulmonary ROS, former smoker,    Pulmonary exam normal breath sounds clear to auscultation       Cardiovascular Exercise Tolerance: Good hypertension, negative cardio ROS   Rhythm:regular Rate:Normal     Neuro/Psych  Headaches, Seizures - (remote),  negative psych ROS   GI/Hepatic Neg liver ROS, GERD  ,  Endo/Other  negative endocrine ROS  Renal/GU negative Renal ROS  negative genitourinary   Musculoskeletal   Abdominal   Peds  Hematology negative hematology ROS (+)   Anesthesia Other Findings   Reproductive/Obstetrics negative OB ROS                             Anesthesia Physical Anesthesia Plan  ASA: 2  Anesthesia Plan: MAC   Post-op Pain Management:    Induction:   PONV Risk Score and Plan: 3 and TIVA, Midazolam and Treatment may vary due to age or medical condition  Airway Management Planned:   Additional Equipment:   Intra-op Plan:   Post-operative Plan:   Informed Consent: I have reviewed the patients History and Physical, chart, labs and discussed the procedure including the risks, benefits and alternatives for the proposed anesthesia with the patient or authorized representative who has indicated his/her understanding and acceptance.     Dental Advisory Given  Plan Discussed with: CRNA  Anesthesia Plan Comments:         Anesthesia Quick Evaluation

## 2021-03-23 ENCOUNTER — Encounter: Payer: Self-pay | Admitting: Ophthalmology

## 2021-03-23 ENCOUNTER — Other Ambulatory Visit: Payer: Self-pay

## 2021-03-28 DIAGNOSIS — H2511 Age-related nuclear cataract, right eye: Secondary | ICD-10-CM | POA: Diagnosis not present

## 2021-04-03 NOTE — Discharge Instructions (Signed)

## 2021-04-05 ENCOUNTER — Encounter
Admission: RE | Disposition: A | Discharge status: Home / Self Care | Payer: Self-pay | Source: Home / Self Care | Attending: Ophthalmology

## 2021-04-05 ENCOUNTER — Other Ambulatory Visit: Payer: Self-pay

## 2021-04-05 ENCOUNTER — Ambulatory Visit: Payer: Medicare HMO | Admitting: Anesthesiology

## 2021-04-05 ENCOUNTER — Encounter: Payer: Self-pay | Admitting: Ophthalmology

## 2021-04-05 ENCOUNTER — Ambulatory Visit
Admission: RE | Admit: 2021-04-05 | Discharge: 2021-04-05 | Disposition: A | Discharge status: Home / Self Care | Payer: Medicare HMO | Attending: Ophthalmology | Admitting: Ophthalmology

## 2021-04-05 DIAGNOSIS — H2511 Age-related nuclear cataract, right eye: Secondary | ICD-10-CM | POA: Insufficient documentation

## 2021-04-05 DIAGNOSIS — Z87891 Personal history of nicotine dependence: Secondary | ICD-10-CM | POA: Insufficient documentation

## 2021-04-05 DIAGNOSIS — H25811 Combined forms of age-related cataract, right eye: Secondary | ICD-10-CM | POA: Diagnosis not present

## 2021-04-05 HISTORY — PX: CATARACT EXTRACTION W/PHACO: SHX586

## 2021-04-05 SURGERY — PHACOEMULSIFICATION, CATARACT, WITH IOL INSERTION
Anesthesia: Monitor Anesthesia Care | Site: Eye | Laterality: Right

## 2021-04-05 MED ORDER — CYCLOPENTOLATE HCL 2 % OP SOLN
1.0000 [drp] | OPHTHALMIC | Status: AC | PRN
  Administered 2021-04-05 (×3): 1 [drp] via OPHTHALMIC

## 2021-04-05 MED ORDER — LACTATED RINGERS IV SOLN: INTRAVENOUS | Status: DC

## 2021-04-05 MED ORDER — MIDAZOLAM HCL 2 MG/2ML IJ SOLN
INTRAMUSCULAR | Status: DC | PRN
  Administered 2021-04-05: 1 mg via INTRAVENOUS

## 2021-04-05 MED ORDER — TETRACAINE HCL 0.5 % OP SOLN
1.0000 [drp] | OPHTHALMIC | Status: AC | PRN
  Administered 2021-04-05 (×3): 1 [drp] via OPHTHALMIC

## 2021-04-05 MED ORDER — SIGHTPATH DOSE#1 BSS IO SOLN
INTRAOCULAR | Status: DC | PRN
  Administered 2021-04-05: 12:00:00 63 mL via OPHTHALMIC

## 2021-04-05 MED ORDER — SIGHTPATH DOSE#1 BSS IO SOLN
INTRAOCULAR | Status: DC | PRN
  Administered 2021-04-05: 1 mL via INTRAMUSCULAR

## 2021-04-05 MED ORDER — BRIMONIDINE TARTRATE-TIMOLOL 0.2-0.5 % OP SOLN
OPHTHALMIC | Status: DC | PRN
  Administered 2021-04-05: 1 [drp] via OPHTHALMIC

## 2021-04-05 MED ORDER — CEFUROXIME OPHTHALMIC INJECTION 1 MG/0.1 ML
INJECTION | OPHTHALMIC | Status: DC | PRN
  Administered 2021-04-05: 0.1 mL via INTRACAMERAL

## 2021-04-05 MED ORDER — SIGHTPATH DOSE#1 NA HYALUR & NA CHOND-NA HYALUR IO KIT
PACK | INTRAOCULAR | Status: DC | PRN
  Administered 2021-04-05: 1 via OPHTHALMIC

## 2021-04-05 MED ORDER — PHENYLEPHRINE HCL 10 % OP SOLN
1.0000 [drp] | OPHTHALMIC | Status: AC | PRN
  Administered 2021-04-05 (×3): 1 [drp] via OPHTHALMIC

## 2021-04-05 MED ORDER — SIGHTPATH DOSE#1 BSS IO SOLN
INTRAOCULAR | Status: DC | PRN
  Administered 2021-04-05: 15 mL

## 2021-04-05 MED ORDER — FENTANYL CITRATE (PF) 100 MCG/2ML IJ SOLN
INTRAMUSCULAR | Status: DC | PRN
  Administered 2021-04-05: 50 ug via INTRAVENOUS

## 2021-04-05 SURGICAL SUPPLY — 9 items
GLOVE SRG 8 PF TXTR STRL LF DI (GLOVE) ×1 IMPLANT
GLOVE SURG ENC TEXT LTX SZ7.5 (GLOVE) ×2 IMPLANT
GLOVE SURG UNDER POLY LF SZ8 (GLOVE) ×2
LENS IOL TECNIS EYHANCE 22.5 (Intraocular Lens) ×1 IMPLANT
NDL FILTER BLUNT 18X1 1/2 (NEEDLE) ×1 IMPLANT
NEEDLE FILTER BLUNT 18X 1/2SAF (NEEDLE) ×1
NEEDLE FILTER BLUNT 18X1 1/2 (NEEDLE) ×1 IMPLANT
SYR 3ML LL SCALE MARK (SYRINGE) ×2 IMPLANT
WATER STERILE IRR 250ML POUR (IV SOLUTION) ×2 IMPLANT

## 2021-04-05 NOTE — Anesthesia Postprocedure Evaluation (Signed)
Anesthesia Post Note  Patient: Danielle Armstrong  Procedure(s) Performed: CATARACT EXTRACTION PHACO AND INTRAOCULAR LENS PLACEMENT (IOC) RIGHT 7.40 00:59.9 (Right: Eye)     Patient location during evaluation: PACU Anesthesia Type: MAC Level of consciousness: awake and alert Pain management: pain level controlled Vital Signs Assessment: post-procedure vital signs reviewed and stable Respiratory status: spontaneous breathing, nonlabored ventilation, respiratory function stable and patient connected to nasal cannula oxygen Cardiovascular status: stable and blood pressure returned to baseline Postop Assessment: no apparent nausea or vomiting Anesthetic complications: no   No notable events documented.  Zoraya Fiorenza A  Manraj Yeo

## 2021-04-05 NOTE — Anesthesia Preprocedure Evaluation (Signed)
Anesthesia Evaluation  Patient identified by MRN, date of birth, ID band Patient awake    Reviewed: Allergy & Precautions, NPO status , Patient's Chart, lab work & pertinent test results, reviewed documented beta blocker date and time   History of Anesthesia Complications (+) PONV and history of anesthetic complications  Airway Mallampati: II  TM Distance: >3 FB Neck ROM: Limited    Dental  (+) Edentulous Lower, Edentulous Upper   Pulmonary Current SmokerPatient did not abstain from smoking., former smoker,    breath sounds clear to auscultation       Cardiovascular hypertension, Pt. on medications and Pt. on home beta blockers (-) angina(-) DOE + dysrhythmias  Rhythm:Regular Rate:Normal   HLD   Neuro/Psych  Headaches, Seizures -,  Anxiety    GI/Hepatic GERD  Controlled, Barrett's esophagus   Endo/Other  Hypothyroidism   Renal/GU      Musculoskeletal  (+) Arthritis ,   Abdominal (+) + obese (BMI 30),   Peds  Hematology   Anesthesia Other Findings   Reproductive/Obstetrics                             Anesthesia Physical Anesthesia Plan  ASA: 2  Anesthesia Plan: MAC   Post-op Pain Management:    Induction: Intravenous  PONV Risk Score and Plan: 3 and TIVA, Midazolam and Treatment may vary due to age or medical condition  Airway Management Planned: Nasal Cannula  Additional Equipment:   Intra-op Plan:   Post-operative Plan:   Informed Consent: I have reviewed the patients History and Physical, chart, labs and discussed the procedure including the risks, benefits and alternatives for the proposed anesthesia with the patient or authorized representative who has indicated his/her understanding and acceptance.       Plan Discussed with: CRNA and Anesthesiologist  Anesthesia Plan Comments:         Anesthesia Quick Evaluation

## 2021-04-05 NOTE — H&P (Signed)
[facility]   Primary Care Physician:  Maryland Pink, MD Ophthalmologist: Dr. Leandrew Koyanagi  Pre-Procedure History & Physical: HPI:  Danielle Armstrong is a 75 y.o. female here for ophthalmic surgery.   Past Medical History:  Diagnosis Date   Barretts esophagus    Cancer ([place]) 1992   cervical   Epilepsy ([place])    nonoe in years   GERD (gastroesophageal reflux disease)    Glaucoma    HLD (hyperlipidemia)    HTN (hypertension)    Irregular heart rate    per patient   Migraines    Neuropathy    Osteoarthritis    Osteoporosis    PONV (postoperative nausea and vomiting)    hospitalized x 1 wk unable to keep anything down orally 1992 AFTER HYSTERECTOMY   Seizures ([place])    none in years   Umbilical hernia     Past Surgical History:  Procedure Laterality Date   ABDOMINAL HYSTERECTOMY     w/oophorectomy   BREAST EXCISIONAL BIOPSY Right 1987   neg   CARPAL TUNNEL RELEASE Right    CATARACT EXTRACTION W/PHACO Left 03/22/2021   Procedure: CATARACT EXTRACTION PHACO AND INTRAOCULAR LENS PLACEMENT (IOC) LEFT 8.57 00:55.7;  Surgeon: Leandrew Koyanagi, MD;  Location: [facility];  Service: Ophthalmology;  Laterality: Left;   COLONOSCOPY     dental extractions     HEMORRHOID SURGERY     KNEE ARTHROSCOPY Left    meniscus repair   PILONIDAL CYST / SINUS EXCISION     SHOULDER ARTHROSCOPY WITH SUBACROMIAL DECOMPRESSION AND BICEP TENDON REPAIR Right 06/26/2018   Procedure: SHOULDER ARTHROSCOPY WITH DEBRIDEMENT, DECOMPRESSION, POSSIBLE ROTATOR CUFF REPAIR AND BICEP TENODESIS-RIGHT;  Surgeon: Corky Mull, MD;  Location: [facility];  Service: Orthopedics;  Laterality: Right;   TENDON REPAIR     right hand   TONSILLECTOMY     TRIGGER FINGER RELEASE Right 03/02/2020   Procedure: RELEASE TRIGGER FINGER/A-1 PULLEY;  Surgeon: Corky Mull, MD;  Location: [facility];  Service: Orthopedics;  Laterality: Right;    Prior to Admission medications   Medication Sig Start Date  End Date Taking? Authorizing Provider  Alpha Lipoic Acid 200 MG CAPS Take 200 mg by mouth 2 (two) times daily.   Yes [provider]  cetirizine (ZYRTEC) 10 MG tablet Take 10 mg by mouth every morning.    Yes [provider]  chlorpheniramine (CHLOR-TRIMETON) 4 MG tablet Take 4 mg by mouth daily as needed for allergies.   Yes [provider]  Ginkgo Biloba 60 MG TABS Take 60 mg by mouth 2 (two) times daily.   Yes [provider]  Inulin (FIBER CHOICE PO) Take 1 tablet by mouth at bedtime. Gummies   Yes [provider]  metoprolol tartrate (LOPRESSOR) 25 MG tablet Take 25 mg by mouth 2 (two) times daily. 04/22/18  Yes [provider]  Misc Natural Products (NEURIVA) CHEW Chew 2 tablets by mouth at bedtime and may repeat dose one time if needed.   Yes [provider]  Nutritional Supplements (FRUIT & VEGETABLE DAILY PO) Take 3 capsules by mouth every evening. Juice Plus Fruit + Berry + Vegetable (3 capsule total daily)   Yes [provider]  OVER THE COUNTER MEDICATION Take 2 tablets by mouth daily in the afternoon. Herpesyl Supplement   Yes [provider]  pantoprazole (PROTONIX) 40 MG tablet Take 40 mg by mouth in the morning and at bedtime. 12/07/19  Yes [provider]  ketotifen (  ZADITOR) 0.025 % ophthalmic solution Place 1 drop into both eyes 2 (two) times daily as needed (irritated eyes.).    [provider]    Allergies as of 02/23/2021   (No Known Allergies)    Family History  Problem Relation Age of Onset   Breast cancer Mother 56    Social History   Socioeconomic History   Marital status: Divorced    Spouse name: Not on file   Number of children: Not on file   Years of education: Not on file   Highest education level: Not on file  Occupational History   Not on file  Tobacco Use   Smoking status: Former    Packs/day: 0.25    Years: 3.00    Pack years: 0.75    Types:  Cigarettes    Quit date: 57    Years since quitting: 32.9   Smokeless tobacco: Never  Vaping Use   Vaping Use: Never used  Substance and Sexual Activity   Alcohol use: Never   Drug use: Never   Sexual activity: Not on file  Other Topics Concern   Not on file  Social History Narrative   Not on file   Social Determinants of Health   Financial Resource Strain: Not on file  Food Insecurity: Not on file  Transportation Needs: Not on file  Physical Activity: Not on file  Stress: Not on file  Social Connections: Not on file  Intimate Partner Violence: Not on file    Review of Systems: See HPI, otherwise negative ROS  Physical Exam: BP (!) 190/89    Pulse 75    Temp 97.7 F (36.5 C) (Temporal)    Wt 80.3 kg    SpO2 95%    BMI 29.91 kg/m  General:   Alert,  pleasant and cooperative in NAD Head:  Normocephalic and atraumatic. Lungs:  Clear to auscultation.    Heart:  Regular rate and rhythm.   Impression/Plan: Danielle Armstrong is here for ophthalmic surgery.  Risks, benefits, limitations, and alternatives regarding ophthalmic surgery have been reviewed with the patient.  Questions have been answered.  All parties agreeable.   Leandrew Koyanagi, MD  04/05/2021, 11:14 AM

## 2021-04-05 NOTE — Transfer of Care (Signed)
Immediate Anesthesia Transfer of Care Note  Patient: Danielle Armstrong  Procedure(s) Performed: CATARACT EXTRACTION PHACO AND INTRAOCULAR LENS PLACEMENT (IOC) RIGHT 7.40 00:59.9 (Right: Eye)  Patient Location: PACU  Anesthesia Type: MAC  Level of Consciousness: awake, alert  and patient cooperative  Airway and Oxygen Therapy: Patient Spontanous Breathing and Patient connected to supplemental oxygen  Post-op Assessment: Post-op Vital signs reviewed, Patient's Cardiovascular Status Stable, Respiratory Function Stable, Patent Airway and No signs of Nausea or vomiting  Post-op Vital Signs: Reviewed and stable  Complications: No notable events documented.

## 2021-04-05 NOTE — Op Note (Signed)
LOCATION:  [place]   PREOPERATIVE DIAGNOSIS:    Nuclear sclerotic cataract right eye. H25.11   POSTOPERATIVE DIAGNOSIS:  Nuclear sclerotic cataract right eye.     PROCEDURE:  Phacoemusification with posterior chamber intraocular lens placement of the right eye   ULTRASOUND TIME: Procedure(s): CATARACT EXTRACTION PHACO AND INTRAOCULAR LENS PLACEMENT (IOC) RIGHT 7.40 00:59.9 (Right)  LENS:   Implant Name Type Inv. Item Serial No. Manufacturer Lot No. LRB No. Used Action  LENS IOL TECNIS EYHANCE 22.5 - [id number] Intraocular Lens LENS IOL TECNIS EYHANCE 22.5 [id number] JOHNSON   Right 1 Implanted         SURGEON:  Wyonia Hough, MD   ANESTHESIA:  Topical with tetracaine drops and 2% Xylocaine jelly, augmented with 1% preservative-free intracameral lidocaine.    COMPLICATIONS:  None.   DESCRIPTION OF PROCEDURE:  The patient was identified in the holding room and transported to the operating room and placed in the supine position under the operating microscope.  The right eye was identified as the operative eye and it was prepped and draped in the usual sterile ophthalmic fashion.   A 1 millimeter clear-corneal paracentesis was made at the 12:00 position.  0.5 ml of preservative-free 1% lidocaine was injected into the anterior chamber. The anterior chamber was filled with Viscoat viscoelastic.  A 2.4 millimeter keratome was used to make a near-clear corneal incision at the 9:00 position.  A curvilinear capsulorrhexis was made with a cystotome and capsulorrhexis forceps.  Balanced salt solution was used to hydrodissect and hydrodelineate the nucleus.   Phacoemulsification was then used in stop and chop fashion to remove the lens nucleus and epinucleus.  The remaining cortex was then removed using the irrigation and aspiration handpiece. Provisc was then placed into the capsular bag to distend it for lens placement.  A lens was then injected into the capsular bag.  The  remaining viscoelastic was aspirated.   Wounds were hydrated with balanced salt solution.  The anterior chamber was inflated to a physiologic pressure with balanced salt solution.  No wound leaks were noted. Cefuroxime 0.1 ml of a 10mg /ml solution was injected into the anterior chamber for a dose of 1 mg of intracameral antibiotic at the completion of the case.   Timolol and Brimonidine drops were applied to the eye.  The patient was taken to the recovery room in stable condition without complications of anesthesia or surgery.   Floretta Petro 04/05/2021, 11:55 AM

## 2021-04-06 ENCOUNTER — Encounter: Payer: Self-pay | Admitting: Ophthalmology

## 2021-05-09 DIAGNOSIS — J209 Acute bronchitis, unspecified: Secondary | ICD-10-CM | POA: Diagnosis not present

## 2021-05-26 ENCOUNTER — Other Ambulatory Visit: Payer: Self-pay | Admitting: Family Medicine

## 2021-05-26 DIAGNOSIS — Z1231 Encounter for screening mammogram for malignant neoplasm of breast: Secondary | ICD-10-CM

## 2021-07-07 ENCOUNTER — Other Ambulatory Visit: Payer: Self-pay

## 2021-07-07 ENCOUNTER — Ambulatory Visit
Admission: RE | Admit: 2021-07-07 | Discharge: 2021-07-07 | Disposition: A | Discharge status: Home / Self Care | Payer: Medicare HMO | Source: Ambulatory Visit | Attending: Family Medicine | Admitting: Family Medicine

## 2021-07-07 DIAGNOSIS — Z1231 Encounter for screening mammogram for malignant neoplasm of breast: Secondary | ICD-10-CM | POA: Diagnosis not present

## 2021-08-04 ENCOUNTER — Other Ambulatory Visit: Payer: Self-pay | Admitting: Surgery

## 2021-08-04 DIAGNOSIS — M65332 Trigger finger, left middle finger: Secondary | ICD-10-CM | POA: Diagnosis not present

## 2021-08-04 DIAGNOSIS — M7522 Bicipital tendinitis, left shoulder: Secondary | ICD-10-CM | POA: Insufficient documentation

## 2021-08-04 DIAGNOSIS — M7582 Other shoulder lesions, left shoulder: Secondary | ICD-10-CM | POA: Insufficient documentation

## 2021-08-04 DIAGNOSIS — M25512 Pain in left shoulder: Secondary | ICD-10-CM | POA: Diagnosis not present

## 2021-08-04 DIAGNOSIS — M65322 Trigger finger, left index finger: Secondary | ICD-10-CM | POA: Insufficient documentation

## 2021-08-09 ENCOUNTER — Other Ambulatory Visit
Admission: RE | Admit: 2021-08-09 | Discharge: 2021-08-09 | Disposition: A | Discharge status: Home / Self Care | Payer: Medicare HMO | Source: Ambulatory Visit | Attending: Surgery | Admitting: Surgery

## 2021-08-09 NOTE — Patient Instructions (Addendum)
?Your procedure is scheduled on: Thursday August 17, 2021. ?Report to Day Surgery inside [facility] 2nd floor, stop by admission desk before getting on elevator. ?To find out your arrival time please call [phone] between 1PM - 3PM on Wednesday  August 15, 2021. ? ?Remember: Instructions that are not followed completely may result in serious medical risk,  ?up to and including death, or upon the discretion of your surgeon and anesthesiologist your  ?surgery may need to be rescheduled.  ? ?  _X__ 1. Do not eat food after midnight the night before your procedure. ?                No chewing gum or hard candies. You may drink clear liquids up to 2 hours ?                before you are scheduled to arrive for your surgery- DO not drink clear ?                liquids within 2 hours of the start of your surgery. ?                Clear Liquids include:  water, apple juice without pulp, clear Gatorade, G2 or  ?                Gatorade Zero (avoid Red/Purple/Blue), Black Coffee or Tea (Do not add ?                anything to coffee or tea). ? ?__X__2.   Complete the "Ensure Clear Pre-surgery Clear Carbohydrate Drink" provided to you, 2 hours before arrival. **If you are diabetic you will be provided with an alternative drink, Gatorade Zero or G2. ? ?__X__3.  On the morning of surgery brush your teeth with toothpaste and water, you ?               may rinse your mouth with mouthwash if you wish.  Do not swallow any toothpaste or mouthwash. ?   ? _X__ 4.  No Alcohol for 24 hours before or after surgery. ? ? _X__ 5.  Do Not Smoke or use e-cigarettes For 24 Hours Prior to Your Surgery. ?                Do not use any chewable tobacco products for at least 6 hours prior to ?                Surgery. ? ?_X__  6.  Do not use any recreational drugs (marijuana, cocaine, heroin, ecstasy, MDMA or other) ?               For at least one week prior to your surgery.  Combination of these drugs with anesthesia ?                May have life threatening results. ? ?____  7  Bring all medications with you on the day of surgery if instructed.  ? ?__X__8.  Notify your doctor if there is any change in your medical condition  ?    (cold, fever, infections). ?    ?Do not wear jewelry, make-up, hairpins, clips or nail polish. ?Do not wear lotions, powders, or perfumes.  deodorant. ?Do not shave 48 hours prior to surgery. Men may shave face and neck. ?Do not bring valuables to the hospital.   ? ?[facility] is not responsible for any belongings or valuables. ? ?Contacts, dentures or  bridgework may not be worn into surgery. ?Leave your suitcase in the car. After surgery it may be brought to your room. ?For patients admitted to the hospital, discharge time is determined by your ?treatment team. ?  ?Patients discharged the day of surgery will not be allowed to drive home.   ?Make arrangements for someone to be with you for the first 24 hours of your ?Same Day Discharge. ? ? ?__X__ Take these medicines the morning of surgery with A SIP OF WATER:  ? ? 1. pantoprazole (PROTONIX) 40 MG  ? 2. metoprolol tartrate (LOPRESSOR) 25 MG ? 3. cetirizine (ZYRTEC) 10 MG ? 4. ? 5. ? 6. ? ?____ Fleet Enema (as directed)  ? ?__X__ Use CHG Soap (or wipes) as directed ? ?____ Use Benzoyl Peroxide Gel as instructed ? ?____ Use inhalers on the day of surgery ? ?____ Stop metformin 2 days prior to surgery   ? ?____ Take 1/2 of usual insulin dose the night before surgery. No insulin the morning ?         of surgery.  ? ?____ Call your PCP, cardiologist, or Pulmonologist if taking Coumadin/Plavix/aspirin and ask when to stop before your surgery.  ? ?__X__ One Week prior to surgery- Stop Anti-inflammatories such as Ibuprofen, Aleve, Advil, Motrin, meloxicam (MOBIC), diclofenac, etodolac, ketorolac, Toradol, Daypro, piroxicam, and aspirin based products such as Excedrin, Goody's or BC powders. OK TO USE TYLENOL IF NEEDED ?  ?__X__ One week prior to surgery stop  ALL supplements until after surgery.  NEURIVA CHEW, FIBER CHOICE, Ginkgo Biloba and Alpha Lipoic Acid  ? ?____ Bring C-Pap to the hospital.  ? ? ?If you have any questions regarding your pre-procedure instructions,  ?Please call Pre-admit Testing at [phone] ?

## 2021-08-10 ENCOUNTER — Other Ambulatory Visit
Admission: RE | Admit: 2021-08-10 | Discharge: 2021-08-10 | Disposition: A | Discharge status: Home / Self Care | Payer: Medicare HMO | Source: Ambulatory Visit | Attending: Surgery | Admitting: Surgery

## 2021-08-10 DIAGNOSIS — Z01810 Encounter for preprocedural cardiovascular examination: Secondary | ICD-10-CM | POA: Diagnosis not present

## 2021-08-14 DIAGNOSIS — I10 Essential (primary) hypertension: Secondary | ICD-10-CM | POA: Diagnosis not present

## 2021-08-14 DIAGNOSIS — L408 Other psoriasis: Secondary | ICD-10-CM | POA: Diagnosis not present

## 2021-08-14 DIAGNOSIS — Z08 Encounter for follow-up examination after completed treatment for malignant neoplasm: Secondary | ICD-10-CM | POA: Diagnosis not present

## 2021-08-14 DIAGNOSIS — D2272 Melanocytic nevi of left lower limb, including hip: Secondary | ICD-10-CM | POA: Diagnosis not present

## 2021-08-14 DIAGNOSIS — L821 Other seborrheic keratosis: Secondary | ICD-10-CM | POA: Diagnosis not present

## 2021-08-14 DIAGNOSIS — Z85828 Personal history of other malignant neoplasm of skin: Secondary | ICD-10-CM | POA: Diagnosis not present

## 2021-08-14 DIAGNOSIS — E785 Hyperlipidemia, unspecified: Secondary | ICD-10-CM | POA: Diagnosis not present

## 2021-08-14 DIAGNOSIS — D2262 Melanocytic nevi of left upper limb, including shoulder: Secondary | ICD-10-CM | POA: Diagnosis not present

## 2021-08-14 DIAGNOSIS — D2271 Melanocytic nevi of right lower limb, including hip: Secondary | ICD-10-CM | POA: Diagnosis not present

## 2021-08-14 DIAGNOSIS — D2261 Melanocytic nevi of right upper limb, including shoulder: Secondary | ICD-10-CM | POA: Diagnosis not present

## 2021-08-14 DIAGNOSIS — D225 Melanocytic nevi of trunk: Secondary | ICD-10-CM | POA: Diagnosis not present

## 2021-08-16 MED ORDER — CEFAZOLIN SODIUM-DEXTROSE 2-4 GM/100ML-% IV SOLN
2.0000 g | INTRAVENOUS | Status: AC
  Administered 2021-08-17: 2 g via INTRAVENOUS

## 2021-08-16 MED ORDER — LACTATED RINGERS IV SOLN: INTRAVENOUS | Status: DC

## 2021-08-16 MED ORDER — ORAL CARE MOUTH RINSE
15.0000 mL | Freq: Once | OROMUCOSAL | Status: AC
Start: 2021-08-16 — End: 2021-08-17

## 2021-08-16 MED ORDER — CHLORHEXIDINE GLUCONATE 0.12 % MT SOLN: 15.0000 mL | Freq: Once | OROMUCOSAL | Status: AC

## 2021-08-17 ENCOUNTER — Ambulatory Visit
Admission: RE | Admit: 2021-08-17 | Discharge: 2021-08-17 | Disposition: A | Discharge status: Home / Self Care | Payer: Medicare HMO | Attending: Surgery | Admitting: Surgery

## 2021-08-17 ENCOUNTER — Ambulatory Visit: Payer: Self-pay

## 2021-08-17 ENCOUNTER — Ambulatory Visit: Payer: Medicare HMO | Admitting: Anesthesiology

## 2021-08-17 ENCOUNTER — Encounter: Payer: Self-pay | Admitting: Surgery

## 2021-08-17 ENCOUNTER — Other Ambulatory Visit: Payer: Self-pay

## 2021-08-17 ENCOUNTER — Encounter
Admission: RE | Disposition: A | Discharge status: Home / Self Care | Payer: Self-pay | Source: Home / Self Care | Attending: Surgery

## 2021-08-17 DIAGNOSIS — M7582 Other shoulder lesions, left shoulder: Secondary | ICD-10-CM | POA: Diagnosis not present

## 2021-08-17 DIAGNOSIS — M19012 Primary osteoarthritis, left shoulder: Secondary | ICD-10-CM | POA: Diagnosis not present

## 2021-08-17 DIAGNOSIS — M65322 Trigger finger, left index finger: Secondary | ICD-10-CM | POA: Diagnosis not present

## 2021-08-17 DIAGNOSIS — M65332 Trigger finger, left middle finger: Secondary | ICD-10-CM | POA: Insufficient documentation

## 2021-08-17 DIAGNOSIS — M24112 Other articular cartilage disorders, left shoulder: Secondary | ICD-10-CM | POA: Diagnosis not present

## 2021-08-17 DIAGNOSIS — Z9889 Other specified postprocedural states: Secondary | ICD-10-CM | POA: Diagnosis not present

## 2021-08-17 DIAGNOSIS — Z8541 Personal history of malignant neoplasm of cervix uteri: Secondary | ICD-10-CM | POA: Insufficient documentation

## 2021-08-17 DIAGNOSIS — M25512 Pain in left shoulder: Secondary | ICD-10-CM | POA: Diagnosis not present

## 2021-08-17 DIAGNOSIS — Z87891 Personal history of nicotine dependence: Secondary | ICD-10-CM | POA: Diagnosis not present

## 2021-08-17 DIAGNOSIS — I10 Essential (primary) hypertension: Secondary | ICD-10-CM | POA: Diagnosis not present

## 2021-08-17 DIAGNOSIS — K219 Gastro-esophageal reflux disease without esophagitis: Secondary | ICD-10-CM | POA: Insufficient documentation

## 2021-08-17 DIAGNOSIS — M25812 Other specified joint disorders, left shoulder: Secondary | ICD-10-CM | POA: Insufficient documentation

## 2021-08-17 DIAGNOSIS — M7522 Bicipital tendinitis, left shoulder: Secondary | ICD-10-CM | POA: Insufficient documentation

## 2021-08-17 DIAGNOSIS — M7542 Impingement syndrome of left shoulder: Secondary | ICD-10-CM | POA: Diagnosis not present

## 2021-08-17 DIAGNOSIS — G8918 Other acute postprocedural pain: Secondary | ICD-10-CM | POA: Diagnosis not present

## 2021-08-17 DIAGNOSIS — E785 Hyperlipidemia, unspecified: Secondary | ICD-10-CM | POA: Diagnosis not present

## 2021-08-17 HISTORY — PX: SHOULDER ARTHROSCOPY WITH SUBACROMIAL DECOMPRESSION, ROTATOR CUFF REPAIR AND BICEP TENDON REPAIR: SHX5687

## 2021-08-17 SURGERY — SHOULDER ARTHROSCOPY WITH SUBACROMIAL DECOMPRESSION, ROTATOR CUFF REPAIR AND BICEP TENDON REPAIR
Anesthesia: General | Laterality: Left

## 2021-08-17 MED ORDER — APREPITANT 40 MG PO CAPS: 40.0000 mg | ORAL_CAPSULE | Freq: Once | ORAL | Status: AC

## 2021-08-17 MED ORDER — FENTANYL CITRATE PF 50 MCG/ML IJ SOSY
PREFILLED_SYRINGE | INTRAMUSCULAR | Status: AC
  Administered 2021-08-17: 50 ug via INTRAVENOUS
  Filled 2021-08-17: qty 1

## 2021-08-17 MED ORDER — CEFAZOLIN SODIUM-DEXTROSE 2-4 GM/100ML-% IV SOLN
INTRAVENOUS | Status: AC
  Filled 2021-08-17: qty 100

## 2021-08-17 MED ORDER — PROPOFOL 10 MG/ML IV BOLUS
INTRAVENOUS | Status: DC | PRN
Start: 2021-08-17 — End: 2021-08-17
  Administered 2021-08-17: 100 mg via INTRAVENOUS

## 2021-08-17 MED ORDER — CHLORHEXIDINE GLUCONATE 0.12 % MT SOLN
OROMUCOSAL | Status: AC
  Administered 2021-08-17: 15 mL via OROMUCOSAL
  Filled 2021-08-17: qty 15

## 2021-08-17 MED ORDER — LIDOCAINE HCL (CARDIAC) PF 100 MG/5ML IV SOSY
PREFILLED_SYRINGE | INTRAVENOUS | Status: DC | PRN
Start: 2021-08-17 — End: 2021-08-17
  Administered 2021-08-17: 40 mg via INTRAVENOUS

## 2021-08-17 MED ORDER — OXYCODONE HCL 5 MG PO TABS: 5.0000 mg | ORAL_TABLET | Freq: Once | ORAL | Status: DC | PRN

## 2021-08-17 MED ORDER — BUPIVACAINE LIPOSOME 1.3 % IJ SUSP
INTRAMUSCULAR | Status: AC
  Filled 2021-08-17: qty 20

## 2021-08-17 MED ORDER — FENTANYL CITRATE (PF) 100 MCG/2ML IJ SOLN
INTRAMUSCULAR | Status: DC | PRN
Start: 2021-08-17 — End: 2021-08-17
  Administered 2021-08-17 (×2): 50 ug via INTRAVENOUS

## 2021-08-17 MED ORDER — MIDAZOLAM HCL 2 MG/2ML IJ SOLN
INTRAMUSCULAR | Status: AC
  Filled 2021-08-17: qty 2

## 2021-08-17 MED ORDER — BUPIVACAINE HCL (PF) 0.5 % IJ SOLN
INTRAMUSCULAR | Status: DC | PRN
  Administered 2021-08-17: 7 mL via PERINEURAL
  Administered 2021-08-17: 3 mL via PERINEURAL

## 2021-08-17 MED ORDER — BUPIVACAINE HCL (PF) 0.5 % IJ SOLN
INTRAMUSCULAR | Status: AC
  Filled 2021-08-17: qty 30

## 2021-08-17 MED ORDER — FENTANYL CITRATE (PF) 100 MCG/2ML IJ SOLN: 25.0000 ug | INTRAMUSCULAR | Status: DC | PRN

## 2021-08-17 MED ORDER — HYDROCODONE-ACETAMINOPHEN 5-325 MG PO TABS: 1.0000 | ORAL_TABLET | Freq: Four times a day (QID) | ORAL | 0 refills | Status: DC | PRN

## 2021-08-17 MED ORDER — ROCURONIUM BROMIDE 100 MG/10ML IV SOLN
INTRAVENOUS | Status: DC | PRN
  Administered 2021-08-17: 50 mg via INTRAVENOUS

## 2021-08-17 MED ORDER — BUPIVACAINE-EPINEPHRINE (PF) 0.5% -1:200000 IJ SOLN
INTRAMUSCULAR | Status: AC
  Filled 2021-08-17: qty 30

## 2021-08-17 MED ORDER — PHENYLEPHRINE HCL-NACL 20-0.9 MG/250ML-% IV SOLN
INTRAVENOUS | Status: DC | PRN
  Administered 2021-08-17: 25 ug/min via INTRAVENOUS

## 2021-08-17 MED ORDER — OXYCODONE HCL 5 MG/5ML PO SOLN: 5.0000 mg | Freq: Once | ORAL | Status: DC | PRN

## 2021-08-17 MED ORDER — GLYCOPYRROLATE 0.2 MG/ML IJ SOLN
INTRAMUSCULAR | Status: DC | PRN
  Administered 2021-08-17: .2 mg via INTRAVENOUS

## 2021-08-17 MED ORDER — FENTANYL CITRATE PF 50 MCG/ML IJ SOSY: 50.0000 ug | PREFILLED_SYRINGE | Freq: Once | INTRAMUSCULAR | Status: AC

## 2021-08-17 MED ORDER — FENTANYL CITRATE (PF) 100 MCG/2ML IJ SOLN
INTRAMUSCULAR | Status: AC
  Filled 2021-08-17: qty 2

## 2021-08-17 MED ORDER — PHENYLEPHRINE 80 MCG/ML (10ML) SYRINGE FOR IV PUSH (FOR BLOOD PRESSURE SUPPORT)
PREFILLED_SYRINGE | INTRAVENOUS | Status: DC | PRN
  Administered 2021-08-17 (×2): 40 ug via INTRAVENOUS

## 2021-08-17 MED ORDER — MIDAZOLAM HCL 2 MG/2ML IJ SOLN
INTRAMUSCULAR | Status: AC
  Administered 2021-08-17: 1 mg via INTRAVENOUS
  Filled 2021-08-17: qty 2

## 2021-08-17 MED ORDER — MIDAZOLAM HCL 2 MG/2ML IJ SOLN: 1.0000 mg | Freq: Once | INTRAMUSCULAR | Status: AC

## 2021-08-17 MED ORDER — DEXAMETHASONE SODIUM PHOSPHATE 10 MG/ML IJ SOLN
INTRAMUSCULAR | Status: DC | PRN
  Administered 2021-08-17: 10 mg via INTRAVENOUS

## 2021-08-17 MED ORDER — ONDANSETRON HCL 4 MG/2ML IJ SOLN
INTRAMUSCULAR | Status: DC | PRN
  Administered 2021-08-17 (×2): 4 mg via INTRAVENOUS

## 2021-08-17 MED ORDER — ACETAMINOPHEN 10 MG/ML IV SOLN
INTRAVENOUS | Status: AC
  Filled 2021-08-17: qty 100

## 2021-08-17 MED ORDER — BUPIVACAINE-EPINEPHRINE 0.5% -1:200000 IJ SOLN
INTRAMUSCULAR | Status: DC | PRN
  Administered 2021-08-17: 30 mL

## 2021-08-17 MED ORDER — BUPIVACAINE LIPOSOME 1.3 % IJ SUSP
INTRAMUSCULAR | Status: DC | PRN
  Administered 2021-08-17: 7 mL via PERINEURAL
  Administered 2021-08-17: 13 mL via PERINEURAL

## 2021-08-17 MED ORDER — EPINEPHRINE PF 1 MG/ML IJ SOLN
INTRAMUSCULAR | Status: AC
  Filled 2021-08-17: qty 2

## 2021-08-17 MED ORDER — BUPIVACAINE HCL (PF) 0.5 % IJ SOLN
INTRAMUSCULAR | Status: AC
  Filled 2021-08-17: qty 10

## 2021-08-17 MED ORDER — BUPIVACAINE HCL (PF) 0.5 % IJ SOLN
INTRAMUSCULAR | Status: DC | PRN
  Administered 2021-08-17: 10 mL

## 2021-08-17 MED ORDER — APREPITANT 40 MG PO CAPS
ORAL_CAPSULE | ORAL | Status: AC
  Administered 2021-08-17: 40 mg via ORAL
  Filled 2021-08-17: qty 1

## 2021-08-17 SURGICAL SUPPLY — 58 items
APL PRP STRL LF DISP 70% ISPRP (MISCELLANEOUS) ×4
BIT DRILL JUGRKNT W/NDL BIT2.9 (DRILL) IMPLANT
BLADE FULL RADIUS 3.5 (BLADE) ×2 IMPLANT
BNDG COHESIVE 4X5 TAN ST LF (GAUZE/BANDAGES/DRESSINGS) ×1 IMPLANT
BNDG ESMARK 4X12 TAN STRL LF (GAUZE/BANDAGES/DRESSINGS) ×1 IMPLANT
BUR ACROMIONIZER 4.0 (BURR) ×2 IMPLANT
CANNULA SHAVER 8MMX76MM (CANNULA) ×2 IMPLANT
CHLORAPREP W/TINT 26 (MISCELLANEOUS) ×5 IMPLANT
CORD BIP STRL DISP 12FT (MISCELLANEOUS) ×1 IMPLANT
COVER LIGHT HANDLE STERIS (MISCELLANEOUS) ×2 IMPLANT
COVER MAYO STAND REUSABLE (DRAPES) ×2 IMPLANT
DILATOR 5.5 THREADED HEALICOIL (MISCELLANEOUS) IMPLANT
DRAPE EXTREMITY 106X87X128.5 (DRAPES) ×1 IMPLANT
DRAPE IMP U-DRAPE 54X76 (DRAPES) ×1 IMPLANT
DRAPE U-SHAPE 47X51 STRL (DRAPES) ×1 IMPLANT
DRILL JUGGERKNOT W/NDL BIT 2.9 (DRILL)
ELECT CAUTERY BLADE 6.4 (BLADE) ×2 IMPLANT
ELECT REM PT RETURN 9FT ADLT (ELECTROSURGICAL) ×2
ELECTRODE REM PT RTRN 9FT ADLT (ELECTROSURGICAL) ×1 IMPLANT
FORCEPS JEWEL BIP 4-3/4 STR (INSTRUMENTS) ×1 IMPLANT
GAUZE SPONGE 4X4 12PLY STRL (GAUZE/BANDAGES/DRESSINGS) ×2 IMPLANT
GAUZE XEROFORM 1X8 LF (GAUZE/BANDAGES/DRESSINGS) ×2 IMPLANT
GLOVE BIOGEL PI IND STRL 8 (GLOVE) ×1 IMPLANT
GLOVE BIOGEL PI INDICATOR 8 (GLOVE) ×1
GLOVE SURG ENC MOIS LTX SZ7.5 (GLOVE) ×4 IMPLANT
GLOVE SURG ENC MOIS LTX SZ8 (GLOVE) ×4 IMPLANT
GLOVE SURG UNDER LTX SZ8 (GLOVE) ×2 IMPLANT
GOWN STRL REUS W/ TWL LRG LVL3 (GOWN DISPOSABLE) ×1 IMPLANT
GOWN STRL REUS W/ TWL XL LVL3 (GOWN DISPOSABLE) ×1 IMPLANT
GOWN STRL REUS W/TWL LRG LVL3 (GOWN DISPOSABLE) ×2
GOWN STRL REUS W/TWL XL LVL3 (GOWN DISPOSABLE) ×2
GRASPER SUT 15 45D LOW PRO (SUTURE) IMPLANT
IV LACTATED RINGER IRRG 3000ML (IV SOLUTION) ×2
IV LR IRRIG 3000ML ARTHROMATIC (IV SOLUTION) ×2 IMPLANT
KIT CANNULA 8X76-LX IN CANNULA (CANNULA) IMPLANT
MANIFOLD NEPTUNE II (INSTRUMENTS) ×4 IMPLANT
MASK FACE SPIDER DISP (MASK) ×2 IMPLANT
MAT ABSORB  FLUID 56X50 GRAY (MISCELLANEOUS) ×1
MAT ABSORB FLUID 56X50 GRAY (MISCELLANEOUS) ×1 IMPLANT
PACK ARTHROSCOPY SHOULDER (MISCELLANEOUS) ×2 IMPLANT
PAD ABD DERMACEA PRESS 5X9 (GAUZE/BANDAGES/DRESSINGS) ×4 IMPLANT
PASSER SUT FIRSTPASS SELF (INSTRUMENTS) IMPLANT
SLING ARM LRG DEEP (SOFTGOODS) ×2 IMPLANT
SLING ULTRA II LG (MISCELLANEOUS) ×2 IMPLANT
SPONGE T-LAP 18X18 ~~LOC~~+RFID (SPONGE) ×3 IMPLANT
STAPLER SKIN PROX 35W (STAPLE) ×2 IMPLANT
STOCKINETTE IMPERVIOUS 9X36 MD (GAUZE/BANDAGES/DRESSINGS) ×1 IMPLANT
STRAP SAFETY 5IN WIDE (MISCELLANEOUS) ×2 IMPLANT
SUT ETHIBOND 0 MO6 C/R (SUTURE) ×2 IMPLANT
SUT PROLENE 4 0 PS 2 18 (SUTURE) ×1 IMPLANT
SUT ULTRABRAID 2 COBRAID 38 (SUTURE) IMPLANT
SUT VIC AB 2-0 CT1 27 (SUTURE) ×4
SUT VIC AB 2-0 CT1 TAPERPNT 27 (SUTURE) ×2 IMPLANT
TAPE MICROFOAM 4IN (TAPE) ×2 IMPLANT
TUBING CONNECTING 10 (TUBING) ×3 IMPLANT
TUBING INFLOW SET DBFLO PUMP (TUBING) ×2 IMPLANT
WAND WEREWOLF FLOW 90D (MISCELLANEOUS) ×2 IMPLANT
WATER STERILE IRR 500ML POUR (IV SOLUTION) ×2 IMPLANT

## 2021-08-17 NOTE — Anesthesia Postprocedure Evaluation (Signed)
Anesthesia Post Note ? ?Patient: Danielle Armstrong ? ?Procedure(s) Performed: LEFT SHOULDER ARTHROSCOPY WITH DEBRIDEMENT, DECOMPRESSION, TENOLYSIS LEFT INDEX AND LONG TRIGGER FINGERS. (Left) ? ?Patient location during evaluation: PACU ?Anesthesia Type: General ?Level of consciousness: awake and alert ?Pain management: pain level controlled ?Vital Signs Assessment: post-procedure vital signs reviewed and stable ?Respiratory status: spontaneous breathing, nonlabored ventilation, respiratory function stable and patient connected to nasal cannula oxygen ?Cardiovascular status: blood pressure returned to baseline and stable ?Postop Assessment: no apparent nausea or vomiting ?Anesthetic complications: no ? ? ?No notable events documented. ? ? ?Last Vitals:  ?Vitals:  ? 08/17/21 1314 08/17/21 1326  ?BP: 138/72 (!) 140/92  ?Pulse: 67 61  ?Resp: 17 18  ?Temp: (!) 36.3 ?C 36.6 ?C  ?SpO2: 93% 94%  ?  ?Last Pain:  ?Vitals:  ? 08/17/21 1326  ?TempSrc: Temporal  ?PainSc: 0-No pain  ? ? ?  ?  ?  ?  ?  ?  ? ?Precious Haws Zariel Capano ? ? ? ? ?

## 2021-08-17 NOTE — Anesthesia Preprocedure Evaluation (Addendum)
Anesthesia Evaluation  ?Patient identified by MRN, date of birth, ID band ?Patient awake ? ? ? ?Reviewed: ?Allergy & Precautions, NPO status , Patient's Chart, lab work & pertinent test results ? ?History of Anesthesia Complications ?(+) PONV and history of anesthetic complications ? ?Airway ?Mallampati: II ? ?TM Distance: >3 FB ?Neck ROM: full ? ? ? Dental ? ?(+) Missing ?  ?Pulmonary ?neg shortness of breath, former smoker,  ?  ?Pulmonary exam normal ? ? ? ? ? ? ? Cardiovascular ?Exercise Tolerance: Good ?hypertension, (-) angina(-) DOE Normal cardiovascular exam ? ? ?  ?Neuro/Psych ? Headaches, Seizures -, Well Controlled,  negative psych ROS  ? GI/Hepatic ?Neg liver ROS, GERD  Controlled,  ?Endo/Other  ?negative endocrine ROS ? Renal/GU ?  ? ?  ?Musculoskeletal ? ?(+) Arthritis ,  ? Abdominal ?  ?Peds ? Hematology ?negative hematology ROS ?(+)   ?Anesthesia Other Findings ?Past Medical History: ?No date: Barretts esophagus ?1992: Cancer ([place]) ?    Comment:  cervical ?No date: Epilepsy ([place]) ?    Comment:  nonoe in years ?No date: GERD (gastroesophageal reflux disease) ?No date: Glaucoma ?No date: HLD (hyperlipidemia) ?No date: HTN (hypertension) ?No date: Irregular heart rate ?    Comment:  per patient ?No date: Migraines ?No date: Neuropathy ?No date: Osteoarthritis ?No date: Osteoporosis ?No date: PONV (postoperative nausea and vomiting) ?    Comment:  hospitalized x 1 wk unable to keep anything down orally  ?             1992 AFTER HYSTERECTOMY ?No date: Seizures ([place]) ?    Comment:  none in years ?No date: Umbilical hernia ? ?Past Surgical History: ?No date: ABDOMINAL HYSTERECTOMY ?    Comment:  w/oophorectomy ?12/2020: basal cyst removed; Left ?    Comment:  cyst removed from LEft knee ?1987: BREAST EXCISIONAL BIOPSY; Right ?    Comment:  neg ?No date: CARPAL TUNNEL RELEASE; Right ?03/22/2021: CATARACT EXTRACTION W/PHACO; Left ?    Comment:  Procedure: CATARACT EXTRACTION  PHACO AND INTRAOCULAR  ?             LENS PLACEMENT (IOC) LEFT 8.57 00:55.7;  Surgeon:  ?             Leandrew Koyanagi, MD;  Location: [place]; ?             Service: Ophthalmology;  Laterality: Left; ?04/05/2021: CATARACT EXTRACTION W/PHACO; Right ?    Comment:  Procedure: CATARACT EXTRACTION PHACO AND INTRAOCULAR  ?             LENS PLACEMENT (IOC) RIGHT 7.40 00:59.9;  Surgeon:  ?             Leandrew Koyanagi, MD;  Location: [place]; ?             Service: Ophthalmology;  Laterality: Right; ?No date: COLONOSCOPY ?No date: dental extractions ?No date: HEMORRHOID SURGERY ?No date: KNEE ARTHROSCOPY; Left ?    Comment:  meniscus repair ?No date: PILONIDAL CYST / SINUS EXCISION ?06/26/2018: SHOULDER ARTHROSCOPY WITH SUBACROMIAL DECOMPRESSION AND  ?BICEP TENDON REPAIR; Right ?    Comment:  Procedure: SHOULDER ARTHROSCOPY WITH DEBRIDEMENT,  ?             DECOMPRESSION, POSSIBLE ROTATOR CUFF REPAIR AND BICEP  ?             TENODESIS-RIGHT;  Surgeon: Corky Mull, MD;  Location:  ?  [facility];  Service: Orthopedics;  Laterality: Right; ?No date: TENDON REPAIR ?    Comment:  right hand ?No date: TONSILLECTOMY ?03/02/2020: TRIGGER FINGER RELEASE; Right ?    Comment:  Procedure: RELEASE TRIGGER FINGER/A-1 PULLEY;  Surgeon:  ?             Corky Mull, MD;  Location: [facility];  Service:  ?             Orthopedics;  Laterality: Right; ? ?BMI   ? Body Mass Index: 29.58 kg/m?  ?  ? ? Reproductive/Obstetrics ?negative OB ROS ? ?  ? ? ? ? ? ? ? ? ? ? ? ? ? ?  ?  ? ? ? ? ? ? ? ? ?Anesthesia Physical ?Anesthesia Plan ? ?ASA: 3 ? ?Anesthesia Plan: General ETT  ? ?Post-op Pain Management: Regional block*  ? ?Induction: Intravenous ? ?PONV Risk Score and Plan: Ondansetron, Dexamethasone, Midazolam, Treatment may vary due to age or medical condition and Aprepitant ? ?Airway Management Planned: Oral ETT ? ?Additional Equipment:  ? ?Intra-op Plan:  ? ?Post-operative Plan: Extubation in  OR ? ?Informed Consent: I have reviewed the patients History and Physical, chart, labs and discussed the procedure including the risks, benefits and alternatives for the proposed anesthesia with the patient or authorized representative who has indicated his/her understanding and acceptance.  ? ? ? ?Dental Advisory Given ? ?Plan Discussed with: Anesthesiologist, CRNA and Surgeon ? ?Anesthesia Plan Comments: (Patient consented for risks of anesthesia including but not limited to:  ?- adverse reactions to medications ?- damage to eyes, teeth, lips or other oral mucosa ?- nerve damage due to positioning  ?- sore throat or hoarseness ?- Damage to heart, brain, nerves, lungs, other parts of body or loss of life ? ?Patient voiced understanding.)  ? ? ? ? ? ? ?Anesthesia Quick Evaluation ? ?

## 2021-08-17 NOTE — H&P (Signed)
History of Present Illness:  ?Danielle Armstrong is a 76 y.o. female who presents for evaluation and treatment of painful catching of her left index and long fingers. Initially, her symptoms were only affected the long finger, but more recently, she has begun to notice similar symptoms in her index finger. She notes that the symptoms have been present for several months and developed without any specific cause or injury. Her symptoms are worse in the morning when she first awakens. She also has pain at night. She finds the fingers quite difficult to bend due to the pain. She has been taking an occasional Aleve as necessary and applying heat for these symptoms with limited benefit. She denies any numbness or paresthesias to her fingers. The patient is status post a right long trigger finger release about a year and a half ago from which she has done quite well. ? ?The patient also presents complaining of a several month history of progressively worsening left shoulder pain which developed without any specific cause or injury. She localizes the pain to the anterolateral aspect of the shoulder with radiation into the upper arm. Her symptoms are aggravated by activities at or above shoulder level as well as when reaching behind her back. She also has pain at night, especially if she rests on her left side. She does not recall any remote injury to her shoulder. She is now three years status post a right shoulder arthroscopy with debridement, decompression, and biceps tenodesis from which she has done quite well. ? ?Current Outpatient Medications: ? alpha lipoic acid 200 mg Tab Take 600 mg by mouth once daily.  ? aspirin (VAZALORE) 81 mg Cap Take by mouth once daily  ? aspirin 81 MG EC tablet Take 81 mg by mouth once daily (Patient not taking: Reported on 11/29/2020)  ? cetirizine (ZYRTEC) 10 MG tablet Take 10 mg by mouth every morning  ? CHLORPHENIRAMINE MALEATE (CHLORTABS ORAL) Take 1 tablet by mouth as needed  ? GINKGO  BILOBA EXTRACT ORAL Take 120 mg by mouth once daily  ? Herbal Supplement Herbal Name: juice plus  ? Herbal Supplement Take by mouth (NEURIVA) CHEW. 2 chews daily  ? inulin/sorbitol (FIBER CHOICE ORAL) Take by mouth 1 daily  ? ketotifen (ZADITOR) 0.025 % (0.035 %) ophthalmic solution Apply to eye  ? metoprolol tartrate (LOPRESSOR) 25 MG tablet TAKE 1 TABLET TWICE DAILY 180 tablet 3  ? pantoprazole (PROTONIX) 40 MG DR tablet TAKE 1 TABLET TWICE DAILY BEFORE MEALS 180 tablet 3  ? REDNESS RELIEVER EYE DROPS 0.05 % ophthalmic solution Place 1 drop into both eyes as needed  ? ?Allergies: ?No Known Allergies ? ?Past Medical History:  ? Allergic state  ? Arthritis of right glenohumeral joint  ? Barrett esophagus  ? Breast cyst  ? Cancer of cervix (CMS-HCC)  ? Chicken pox  ? Depression  ? Epilepsy (CMS-HCC)  ? GERD (gastroesophageal reflux disease)  ? Glaucoma (increased eye pressure)  ? H/O colonoscopy 05/2013  ? Hemorrhoids  ? Hyperlipidemia  ? Hypertension  ? Migraines  ? Neuropathy for a couple of years - getting worse  ? Osteoarthritis  ? Osteoporosis, post-menopausal  ? Pleurisy  ? Seizures (CMS-HCC)  ? Umbilical hernia  ? ?Past Surgical History:  ? zostavax 03/2011  ? pneumovax 03/2013  ? Tdap 03/2013  ? COLONOSCOPY 05/26/2013  ? Arthroscopic partial medial meniscectomy plus debr. and coblation of medial femoral chondral lesion Left 08/13/2014 (Dr. Jefm Bryant)  ? EXTENSIVE ARTHROSCOPIC DEBRIDEMENT, ARTHROSCOPIC SUBACROMIAL DECOMPRESSION,  AND MINI OPEN BICEPS TENODESIS , RIGHT SHOULDER Right 06/26/2018 (Dr. Roland Rack) ? Release right long trigger finger. Right 03/02/2020 (Dr. Roland Rack)  ? CARPAL TUNNEL RELEASE  ? Cyst removed from breast  ? Cyst Removed from under Right wrist  ? EXCISION PILONIDAL CYST/SINUS  ? Hemorrhoid surgery  ? HYSTERECTOMY VAGINAL  ? OOPHORECTOMY (complete hyst. in 1992)  ? oral teeth extraction  ? Right carpal tunnel  ? Right Hand Tendon Removed  ? TONSILLECTOMY  ? ?Family History:  ? Breast cancer Mother   ? Arthritis Sister  ? Rheum arthritis Son  ? Epilepsy Other (father's side of family)  ? Osteoporosis (Thinning of bones) Other (mother's side of family)  ? Stroke Other  ? ?Social History:  ? ?Socioeconomic History:  ? Marital status: Divorced  ?Tobacco Use  ? Smoking status: Former  ?Types: Cigarettes  ?Quit date: 04/24/1963  ?Years since quitting: 58.3  ? Smokeless tobacco: Never  ? Tobacco comments:  ?Smoked lightly on and off for a couple decades  ? Vaping Use: Never used  ?Substance and Sexual Activity  ? Alcohol use: No  ?Alcohol/week: 0.0 standard drinks  ? Drug use: No  ? Sexual activity: Not Currently  ? ?Review of Systems:  ?A comprehensive 14 point ROS was performed, reviewed, and the pertinent orthopaedic findings are documented in the HPI. ? ?Physical Exam: ?Vitals:  ?08/04/21 1213  ?BP: 136/78  ?Weight: 80.8 kg (178 lb 3.2 oz)  ?Height: 165.1 cm ('5\' 5"'$ )  ?PainSc: 9  ?PainLoc: Shoulder  ? ?General/Constitutional: The patient appears to be well-nourished, well-developed, and in no acute distress. ?Neuro/Psych: Normal mood and affect, oriented to person, place and time. ?Eyes: Non-icteric. Pupils are equal, round, and reactive to light, and exhibit synchronous movement. ?ENT: Unremarkable. ?Lymphatic: No palpable adenopathy. ?Respiratory: Lungs clear to auscultation, Normal chest excursion, No wheezes and Non-labored breathing ?Cardiovascular: Regular rate and rhythm. No murmurs. and No edema, swelling or tenderness, except as noted in detailed exam. ?Integumentary: No impressive skin lesions present, except as noted in detailed exam. ?Musculoskeletal: Unremarkable, except as noted in detailed exam.  ? ?Left shoulder exam: ?SKIN: normal ?SWELLING: none ?WARMTH: none ?LYMPH NODES: no adenopathy palpable ?CREPITUS: none ?TENDERNESS: Mildly tender over anterolateral shoulder ?ROM (active):  ?Forward flexion: 135 degrees ?Abduction: 130 degrees ?Internal rotation: L3 ?ROM (passive):  ?Forward flexion: 150  degrees ?Abduction: 140 degrees  ?ER/IR at 90 abd: 85 degrees / 60 degrees ? ?She describes mild to moderate pain with forward flexion abduction, and internal rotation, and mild pain at extremes of internal and external rotation at 90 degrees of abduction. ? ?STRENGTH: Forward flexion: 4-4+/5 ?Abduction: 4-4+/5 ?External rotation: 4-4+/5 ?Internal rotation: 4+/5 ?Pain with RC testing: Moderate pain with resisted forward flexion and abduction, and mild pain with resisted external rotation ? ?STABILITY: Normal ? ?SPECIAL TESTS: Luan Pulling' test: positive, mild-moderate ?Speed's test: positive ?Capsulitis - pain w/ passive ER: no ?Crossed arm test: Mildly positive ?Crank: Not evaluated ?Anterior apprehension: Negative ?Posterior apprehension: Not evaluated ? ?Left hand exam: ?Skin inspection of the left hand is unremarkable. No swelling, erythema, ecchymosis, abrasions, or other skin abnormalities identified. She has mild-moderate tenderness to palpation over the volar aspects of the left index and long metacarpal heads. Small masses are palpable in both areas which appear to move as she flexes and extends these fingers. She exhibits moderate apprehension and tenderness as she starts to flex her index and long fingers, although she does not flex her finger sufficiently far as to demonstrate an obvious  trigger. She is neurovascularly intact to all digits. ? ?X-rays/MRI/Lab data:  ?AP, Y-scapular, and axillary views of the left shoulder are obtained. These films demonstrate no evidence for fractures, lytic lesions, or significant degenerative changes. The subacromial space is well-maintained with a laterally downsloping acromion. She exhibits a type II acromion. ? ?Assessment: ? Trigger index finger of left hand  ? Trigger middle finger of left hand  ? Tendinitis of upper biceps tendon of left shoulder  ? Rotator cuff tendinitis, left  ? ?Plan: ?The treatment options were discussed with the patient. In addition, patient  educational materials were provided regarding the diagnosis and treatment options. The patient is quite frustrated by her symptoms and functional limitations as they pertain both to her shoulder and to her hand

## 2021-08-17 NOTE — Transfer of Care (Signed)
Immediate Anesthesia Transfer of Care Note ? ?Patient: Danielle Armstrong ? ?Procedure(s) Performed: LEFT SHOULDER ARTHROSCOPY WITH DEBRIDEMENT, DECOMPRESSION, TENOLYSIS LEFT INDEX AND LONG TRIGGER FINGERS. (Left) ? ?Patient Location: PACU ? ?Anesthesia Type:General ? ?Level of Consciousness: awake, drowsy and patient cooperative ? ?Airway & Oxygen Therapy: Patient Spontanous Breathing and Patient connected to face mask oxygen ? ?Post-op Assessment: Report given to RN and Post -op Vital signs reviewed and stable ? ?Post vital signs: Reviewed and stable ? ?Last Vitals:  ?Vitals Value Taken Time  ?BP    ?Temp    ?Pulse 76 08/17/21 1240  ?Resp 20 08/17/21 1240  ?SpO2 98 % 08/17/21 1240  ?Vitals shown include unvalidated device data. ? ?Last Pain:  ?Vitals:  ? 08/17/21 0931  ?TempSrc: Oral  ?PainSc: 5   ?   ? ?Patients Stated Pain Goal: 0 (08/17/21 0931) ? ?Complications: No notable events documented. ?

## 2021-08-17 NOTE — Op Note (Signed)
08/17/2021 ? ?12:24 PM ? ?Patient:   Danielle Armstrong ? ?Pre-Op Diagnosis:   1.  Impingement/tendinopathy with possible rotator cuff tear, left shoulder.  2.  Left index trigger finger.  3.  Left long trigger finger. ? ?Post-Op Diagnosis:   1.  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  2.  Left index trigger finger.  3.  Left long trigger finger. ? ?Procedure:   1.  Extensive arthroscopic debridement, arthroscopic subacromial decompression, and biceps tenolysis, left shoulder.  2.  Release left index trigger finger.  3.  Release left long trigger finger. ? ?Anesthesia:   General endotracheal with interscalene block using Exparel placed preoperatively by the anesthesiologist. ? ?Surgeon:   Pascal Lux, MD ? ?Assistant:   Cameron Proud, PA-C; Myra Rude, PA-S ? ?Findings:   As above. There were grade 3 chondromalacial changes involving the central portion of the humeral head, and grade 2 chondromalacial changes involving the central glenoid. There was mild bursal sided fraying of the supraspinatus tendon. Otherwise, the rotator cuff was in excellent condition. There was moderate fraying of the labrum anteriorly, superiorly, and posteriorly without frank detachment from the glenoid rim. The biceps tendon demonstrated moderate tendinopathic changes without partial or full-thickness tears. ? ?Complications:   None ? ?Fluids:   700 cc ? ?Estimated blood loss:   5 cc ? ?Tourniquet time:   29 minutes at 250 mmHg ? ?Drains:   None ? ?Closure:   Staples for the shoulder and 4-0 Prolene interrupted sutures for the hand ? ?Brief clinical note:   The patient is a 76 year old female with a history of progressive worsening left shoulder pain. The patient's symptoms have progressed despite medications, activity modification, etc. The patient's history and examination are consistent with impingement/tendinopathy with a possible rotator cuff tear. The patient presents  at this time for a left shoulder arthroscopy with debridement, decompression, and possible rotator cuff repair. ? ?The patient also complains of recurrent painful catching of her left index and long fingers.  Her history and examination are consistent with left index and long trigger fingers.  The patient presents for release of the left index and long trigger fingers. ? ?Procedure:   The patient underwent placement of an interscalene block using Exparel by the anesthesiologist in the preoperative holding area before being brought into the operating room and lain in the supine position. The patient then underwent general endotracheal intubation and anesthesia before the left hand was positioned for release of the left index and long trigger fingers. The left hand and upper extremity were prepped with ChloraPrep solution before being draped sterilely. Preoperative antibiotics were administered. A timeout was performed to verify the appropriate surgical site before the limb was exsanguinated with an Esmarch and the tourniquet inflated to 250 mmHg. ? ?First, the left index finger was addressed.  An approximately 1.5-2.0 cm incision was made over the volar aspect of the left index finger at the level of the metacarpal head centered over the flexor sheath. The incision was carried down through the subcutaneous tissues with care taken to identify and protect the digital neurovascular structures. The flexor sheath was entered just proximal to the A1 pulley. The sheath was released proximally for several centimeters under direct visualization. Distally, a clamp was placed beneath the A1 pulley and used to release any adhesions. The clamp was repositioned so that one jaw was superficial to and the other jaw deep to the A1 pulley. The A1 pulley was incised on  either side of the clamp to remove a 2 mm strip of tissue. Metzenbaum scissors were used to ensure complete release of the A1 pulley more distally. The underlying  tendons were carefully inspected and found to be intact. ? ?Next, the left long finger was addressed.  An approximately 1.5-2.0 cm incision was made over the volar aspect of the left long finger at the level of the metacarpal head centered over the flexor sheath. The incision was carried down through the subcutaneous tissues with care taken to identify and protect the digital neurovascular structures. The flexor sheath was entered just proximal to the A1 pulley. The sheath was released proximally for several centimeters under direct visualization. Distally, a clamp was placed beneath the A1 pulley and used to release any adhesions. The clamp was repositioned so that one jaw was superficial to and the other jaw deep to the A1 pulley. The A1 pulley was incised on either side of the clamp to remove a 2 mm strip of tissue. Metzenbaum scissors were used to ensure complete release of the A1 pulley more distally. The underlying tendons were carefully inspected and found to be intact. ? ?The wounds were copiously irrigated with sterile saline solution before the wound was closed using 4-0 Prolene interrupted sutures. A total of 10 cc of 0.5% plain Sensorcaine was injected in and around the incisions to help with postoperative analgesia before a sterile bulky dressing was applied to the hand. ? ?The patient was then repositioned in the beach chair position using the beach chair positioner in order to address her left shoulder. The left shoulder and upper extremity were prepped with ChloraPrep solution before being draped sterilely. Preoperative antibiotics were administered. A repeat timeout was performed to confirm the proper surgical site before the expected portal sites and incision site were injected with 0.5% Sensorcaine with epinephrine.  ? ?A posterior portal was created and the glenohumeral joint thoroughly inspected with the findings as described above. An anterior portal was created using an outside-in technique.  The labrum and rotator cuff were further probed, again confirming the above-noted findings. Areas of extensive synovitis were debrided back to stable margins, as were frayed portions of the anterior, superior, and posterior labrum. In addition, areas of grade III chondromalacia on the humeral head with loose articular cartilage were debrided back to stable margins using the full-radius resector. The ArthroCare wand was inserted and used to release the biceps tendon from its labral anchor.  It also was used to obtain hemostasis as well as to "anneal" the labrum superiorly and anteriorly. The instruments were removed from the joint after suctioning the excess fluid. ? ?The camera was repositioned through the posterior portal into the subacromial space. A separate lateral portal was created using an outside-in technique. The 3.5 mm full-radius resector was introduced and used to perform a subtotal bursectomy. The ArthroCare wand was then inserted and used to remove the periosteal tissue off the undersurface of the anterior third of the acromion as well as to recess the coracoacromial ligament from its attachment along the anterior and lateral margins of the acromion. The 4.0 mm acromionizing bur was introduced and used to complete the decompression by removing the undersurface of the anterior third of the acromion. The full radius resector was reintroduced to remove any residual bony debris before the ArthroCare wand was reintroduced to obtain hemostasis. The bursal surface of the rotator cuff again was thoroughly inspected both from the posterior and lateral portals. Other than mild fraying of the bursal  surface of the supraspinatus tendon, the rotator cuff appeared to be in in good condition. Therefore, the instruments were then removed from the subacromial space after suctioning the excess fluid. ? ?The portal sites were closed using staples. A sterile bulky dressing was applied to the shoulder before the arm was  placed into a shoulder immobilizer. The patient was then awakened, extubated, and returned to the recovery room in satisfactory condition after tolerating the procedure well. ?

## 2021-08-17 NOTE — Anesthesia Procedure Notes (Signed)
Anesthesia Regional Block: Interscalene brachial plexus block  ? ?Pre-Anesthetic Checklist: , timeout performed,  Correct Patient, Correct Site, Correct Laterality,  Correct Procedure, Correct Position, site marked,  Risks and benefits discussed,  Surgical consent,  Pre-op evaluation,  At surgeon's request and post-op pain management ? ?Laterality: Upper and Left ? ?Prep: chloraprep     ?  ?Needles:  ?Injection technique: Single-shot ? ?Needle Type: Stimiplex   ? ? ?Needle Length: 9cm  ?Needle Gauge: 22  ? ? ? ?Additional Needles: ? ? ?Procedures:,,,, ultrasound used (permanent image in chart),,    ?Narrative:  ?Start time: 08/17/2021 9:48 AM ?End time: 08/17/2021 9:50 AM ?Injection made incrementally with aspirations every 5 mL. ? ?Performed by: Personally  ?Anesthesiologist: Ritik Stavola, Precious Haws, MD ? ?Additional Notes: ?Patient consented for risk and benefits of nerve block including but not limited to nerve damage, failed block, bleeding and infection.  Patient voiced understanding. ? ?Functioning IV was confirmed and monitors were applied.  Timeout done prior to procedure and prior to any sedation being given to the patient.  Patient confirmed procedure site prior to any sedation given to the patient.  A 52m 22ga Stimuplex needle was used. Sterile prep,hand hygiene and sterile gloves were used.  Minimal sedation used for procedure.  No paresthesia endorsed by patient during the procedure.  Negative aspiration and negative test dose prior to incremental administration of local anesthetic. The patient tolerated the procedure well with no immediate complications. ? ? ? ?

## 2021-08-17 NOTE — Discharge Instructions (Addendum)
Orthopedic discharge instructions: ?Keep dressing dry and intact.  ?May shower after dressing changed on post-op day #4 (Monday).  ?Cover staples with Band-Aids after drying off. ?Apply ice frequently to shoulder. ?Take ibuprofen 600 mg TID with meals for 5-7 days, then as necessary. ?Take pain medication as prescribed or ES Tylenol if necessary. ?Keep shoulder immobilizer on at all times except may remove for bathing purposes. ?Follow-up in 10-14 days or as scheduled.AMBULATORY SURGERY  ?DISCHARGE INSTRUCTIONS ? ? ?The drugs that you were given will stay in your system until tomorrow so for the next 24 hours you should not: ? ?Drive an automobile ?Make any legal decisions ?Drink any alcoholic beverage ? ? ?You may resume regular meals tomorrow.  Today it is better to start with liquids and gradually work up to solid foods. ? ?You may eat anything you prefer, but it is better to start with liquids, then soup and crackers, and gradually work up to solid foods. ? ? ?Please notify your doctor immediately if you have any unusual bleeding, trouble breathing, redness and pain at the surgery site, drainage, fever, or pain not relieved by medication. ? ? ? ?Additional Instructions: ? ? ? ? ? ? ? ?Please contact your physician with any problems or Same Day Surgery at [phone], Monday through Friday 6 am to 4 pm, or [place] at [facility] number at [phone].  ?

## 2021-08-17 NOTE — Anesthesia Procedure Notes (Signed)
Procedure Name: Intubation ?Date/Time: 08/17/2021 10:33 AM ?Performed by: Kelton Pillar, CRNA ?Pre-anesthesia Checklist: Patient identified, Emergency Drugs available, Suction available and Patient being monitored ?Patient Re-evaluated:Patient Re-evaluated prior to induction ?Oxygen Delivery Method: Circle system utilized ?Preoxygenation: Pre-oxygenation with 100% oxygen ?Induction Type: IV induction ?Ventilation: Mask ventilation without difficulty ?Laryngoscope Size: McGraph and 3 ?Grade View: Grade I ?Tube type: Oral ?Tube size: 6.5 mm ?Number of attempts: 1 ?Airway Equipment and Method: Stylet and Oral airway ?Placement Confirmation: ETT inserted through vocal cords under direct vision, positive ETCO2, breath sounds checked- equal and bilateral and CO2 detector ?Secured at: 21 cm ?Tube secured with: Tape ?Dental Injury: Teeth and Oropharynx as per pre-operative assessment  ? ? ? ? ?

## 2021-08-18 ENCOUNTER — Encounter: Payer: Self-pay | Admitting: Surgery

## 2021-08-22 DIAGNOSIS — Z0000 Encounter for general adult medical examination without abnormal findings: Secondary | ICD-10-CM | POA: Diagnosis not present

## 2021-08-22 DIAGNOSIS — E785 Hyperlipidemia, unspecified: Secondary | ICD-10-CM | POA: Diagnosis not present

## 2021-08-22 DIAGNOSIS — M47816 Spondylosis without myelopathy or radiculopathy, lumbar region: Secondary | ICD-10-CM | POA: Diagnosis not present

## 2021-08-22 DIAGNOSIS — R7301 Impaired fasting glucose: Secondary | ICD-10-CM | POA: Diagnosis not present

## 2021-08-22 DIAGNOSIS — I10 Essential (primary) hypertension: Secondary | ICD-10-CM | POA: Diagnosis not present

## 2021-08-22 DIAGNOSIS — Z1389 Encounter for screening for other disorder: Secondary | ICD-10-CM | POA: Diagnosis not present

## 2021-08-22 DIAGNOSIS — R109 Unspecified abdominal pain: Secondary | ICD-10-CM | POA: Diagnosis not present

## 2021-08-22 DIAGNOSIS — M545 Low back pain, unspecified: Secondary | ICD-10-CM | POA: Diagnosis not present

## 2021-08-22 NOTE — Progress Notes (Signed)
Aprepitant was given for PONV prophylaxis due to this patients anesthetic history ?

## 2021-08-25 DIAGNOSIS — M25512 Pain in left shoulder: Secondary | ICD-10-CM | POA: Diagnosis not present

## 2021-08-25 DIAGNOSIS — Z4789 Encounter for other orthopedic aftercare: Secondary | ICD-10-CM | POA: Diagnosis not present

## 2021-08-28 DIAGNOSIS — M25512 Pain in left shoulder: Secondary | ICD-10-CM | POA: Diagnosis not present

## 2021-08-28 DIAGNOSIS — Z4789 Encounter for other orthopedic aftercare: Secondary | ICD-10-CM | POA: Diagnosis not present

## 2021-08-31 DIAGNOSIS — M25512 Pain in left shoulder: Secondary | ICD-10-CM | POA: Diagnosis not present

## 2021-08-31 DIAGNOSIS — Z4789 Encounter for other orthopedic aftercare: Secondary | ICD-10-CM | POA: Diagnosis not present

## 2021-09-08 DIAGNOSIS — M25512 Pain in left shoulder: Secondary | ICD-10-CM | POA: Diagnosis not present

## 2021-09-08 DIAGNOSIS — Z4789 Encounter for other orthopedic aftercare: Secondary | ICD-10-CM | POA: Diagnosis not present

## 2021-09-12 DIAGNOSIS — M25512 Pain in left shoulder: Secondary | ICD-10-CM | POA: Diagnosis not present

## 2021-09-12 DIAGNOSIS — Z4789 Encounter for other orthopedic aftercare: Secondary | ICD-10-CM | POA: Diagnosis not present

## 2021-09-14 DIAGNOSIS — Z4789 Encounter for other orthopedic aftercare: Secondary | ICD-10-CM | POA: Diagnosis not present

## 2021-09-14 DIAGNOSIS — M25512 Pain in left shoulder: Secondary | ICD-10-CM | POA: Diagnosis not present

## 2021-09-19 DIAGNOSIS — M25512 Pain in left shoulder: Secondary | ICD-10-CM | POA: Diagnosis not present

## 2021-09-19 DIAGNOSIS — Z4789 Encounter for other orthopedic aftercare: Secondary | ICD-10-CM | POA: Diagnosis not present

## 2021-09-21 DIAGNOSIS — M25512 Pain in left shoulder: Secondary | ICD-10-CM | POA: Diagnosis not present

## 2021-09-21 DIAGNOSIS — Z4789 Encounter for other orthopedic aftercare: Secondary | ICD-10-CM | POA: Diagnosis not present

## 2021-09-28 DIAGNOSIS — M25512 Pain in left shoulder: Secondary | ICD-10-CM | POA: Diagnosis not present

## 2021-09-28 DIAGNOSIS — Z4789 Encounter for other orthopedic aftercare: Secondary | ICD-10-CM | POA: Diagnosis not present

## 2021-10-05 DIAGNOSIS — Z4789 Encounter for other orthopedic aftercare: Secondary | ICD-10-CM | POA: Diagnosis not present

## 2021-10-05 DIAGNOSIS — M25512 Pain in left shoulder: Secondary | ICD-10-CM | POA: Diagnosis not present

## 2021-10-09 DIAGNOSIS — M7522 Bicipital tendinitis, left shoulder: Secondary | ICD-10-CM | POA: Diagnosis not present

## 2021-10-09 DIAGNOSIS — M7582 Other shoulder lesions, left shoulder: Secondary | ICD-10-CM | POA: Diagnosis not present

## 2021-10-12 DIAGNOSIS — Z4789 Encounter for other orthopedic aftercare: Secondary | ICD-10-CM | POA: Diagnosis not present

## 2021-10-12 DIAGNOSIS — M25512 Pain in left shoulder: Secondary | ICD-10-CM | POA: Diagnosis not present

## 2021-10-16 DIAGNOSIS — M25512 Pain in left shoulder: Secondary | ICD-10-CM | POA: Diagnosis not present

## 2021-10-16 DIAGNOSIS — Z4789 Encounter for other orthopedic aftercare: Secondary | ICD-10-CM | POA: Diagnosis not present

## 2021-10-19 DIAGNOSIS — M25512 Pain in left shoulder: Secondary | ICD-10-CM | POA: Diagnosis not present

## 2021-10-19 DIAGNOSIS — Z4789 Encounter for other orthopedic aftercare: Secondary | ICD-10-CM | POA: Diagnosis not present

## 2021-10-25 DIAGNOSIS — M25512 Pain in left shoulder: Secondary | ICD-10-CM | POA: Diagnosis not present

## 2021-10-25 DIAGNOSIS — Z4789 Encounter for other orthopedic aftercare: Secondary | ICD-10-CM | POA: Diagnosis not present

## 2021-10-27 DIAGNOSIS — Z4789 Encounter for other orthopedic aftercare: Secondary | ICD-10-CM | POA: Diagnosis not present

## 2021-10-27 DIAGNOSIS — M25512 Pain in left shoulder: Secondary | ICD-10-CM | POA: Diagnosis not present

## 2021-10-30 DIAGNOSIS — Z4789 Encounter for other orthopedic aftercare: Secondary | ICD-10-CM | POA: Diagnosis not present

## 2021-10-30 DIAGNOSIS — M25512 Pain in left shoulder: Secondary | ICD-10-CM | POA: Diagnosis not present

## 2021-11-02 DIAGNOSIS — M25512 Pain in left shoulder: Secondary | ICD-10-CM | POA: Diagnosis not present

## 2021-11-02 DIAGNOSIS — Z4789 Encounter for other orthopedic aftercare: Secondary | ICD-10-CM | POA: Diagnosis not present

## 2021-11-06 DIAGNOSIS — H35372 Puckering of macula, left eye: Secondary | ICD-10-CM | POA: Diagnosis not present

## 2021-11-06 DIAGNOSIS — Z961 Presence of intraocular lens: Secondary | ICD-10-CM | POA: Diagnosis not present

## 2021-11-07 DIAGNOSIS — Z4789 Encounter for other orthopedic aftercare: Secondary | ICD-10-CM | POA: Diagnosis not present

## 2021-11-07 DIAGNOSIS — M25512 Pain in left shoulder: Secondary | ICD-10-CM | POA: Diagnosis not present

## 2021-11-10 DIAGNOSIS — Z4789 Encounter for other orthopedic aftercare: Secondary | ICD-10-CM | POA: Diagnosis not present

## 2021-11-10 DIAGNOSIS — M25512 Pain in left shoulder: Secondary | ICD-10-CM | POA: Diagnosis not present

## 2021-11-20 DIAGNOSIS — M65322 Trigger finger, left index finger: Secondary | ICD-10-CM | POA: Diagnosis not present

## 2021-11-20 DIAGNOSIS — M7522 Bicipital tendinitis, left shoulder: Secondary | ICD-10-CM | POA: Diagnosis not present

## 2021-11-20 DIAGNOSIS — M7582 Other shoulder lesions, left shoulder: Secondary | ICD-10-CM | POA: Diagnosis not present

## 2021-11-20 DIAGNOSIS — M65332 Trigger finger, left middle finger: Secondary | ICD-10-CM | POA: Diagnosis not present

## 2022-02-23 DIAGNOSIS — J329 Chronic sinusitis, unspecified: Secondary | ICD-10-CM | POA: Diagnosis not present

## 2022-02-23 DIAGNOSIS — J40 Bronchitis, not specified as acute or chronic: Secondary | ICD-10-CM | POA: Diagnosis not present

## 2022-02-23 DIAGNOSIS — B9689 Other specified bacterial agents as the cause of diseases classified elsewhere: Secondary | ICD-10-CM | POA: Diagnosis not present

## 2022-04-08 IMAGING — MG MM DIGITAL SCREENING BILAT W/ TOMO AND CAD
6 of 10 series · 6 of 30 positions shown · non-contrast
Comparison: Previous exam(s).

CLINICAL DATA: Screening.

EXAM:
DIGITAL SCREENING BILATERAL MAMMOGRAM WITH TOMOSYNTHESIS AND CAD
TECHNIQUE: Bilateral screening digital craniocaudal and mediolateral oblique
mammograms were obtained. Bilateral screening digital breast
tomosynthesis was performed. The images were evaluated with
computer-aided detection.

[L CC synth-2D]
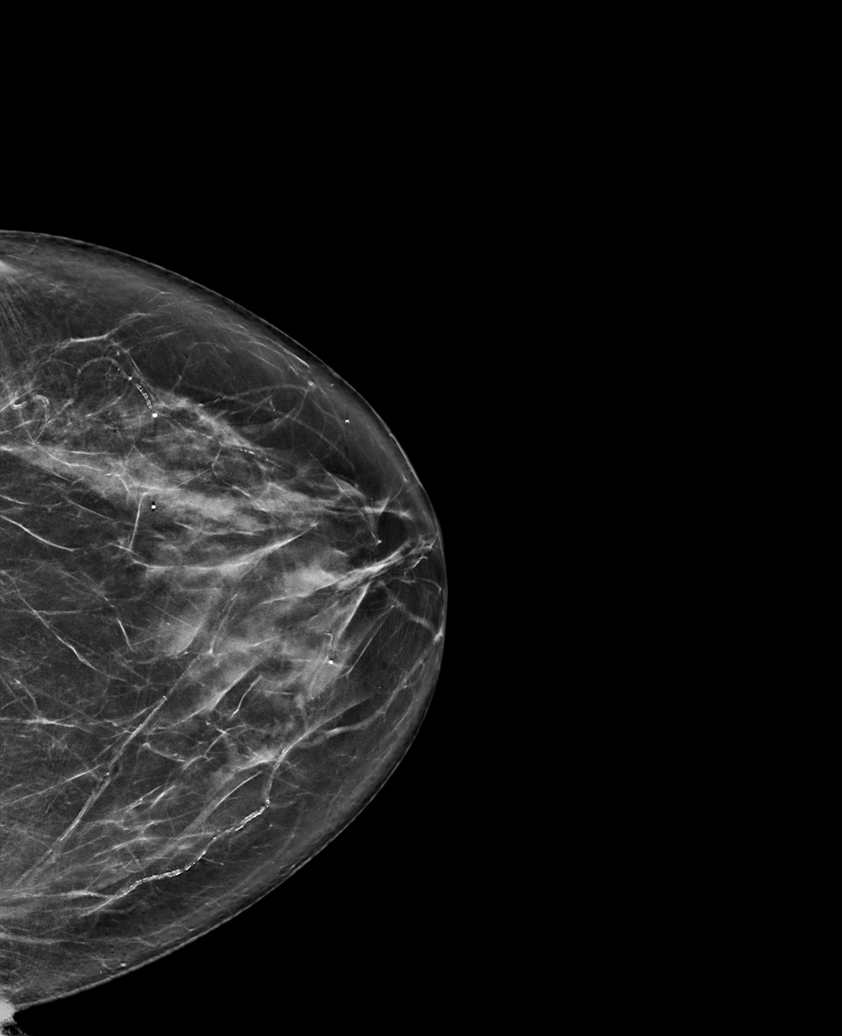

[L MLO synth-2D (1 of 2)]
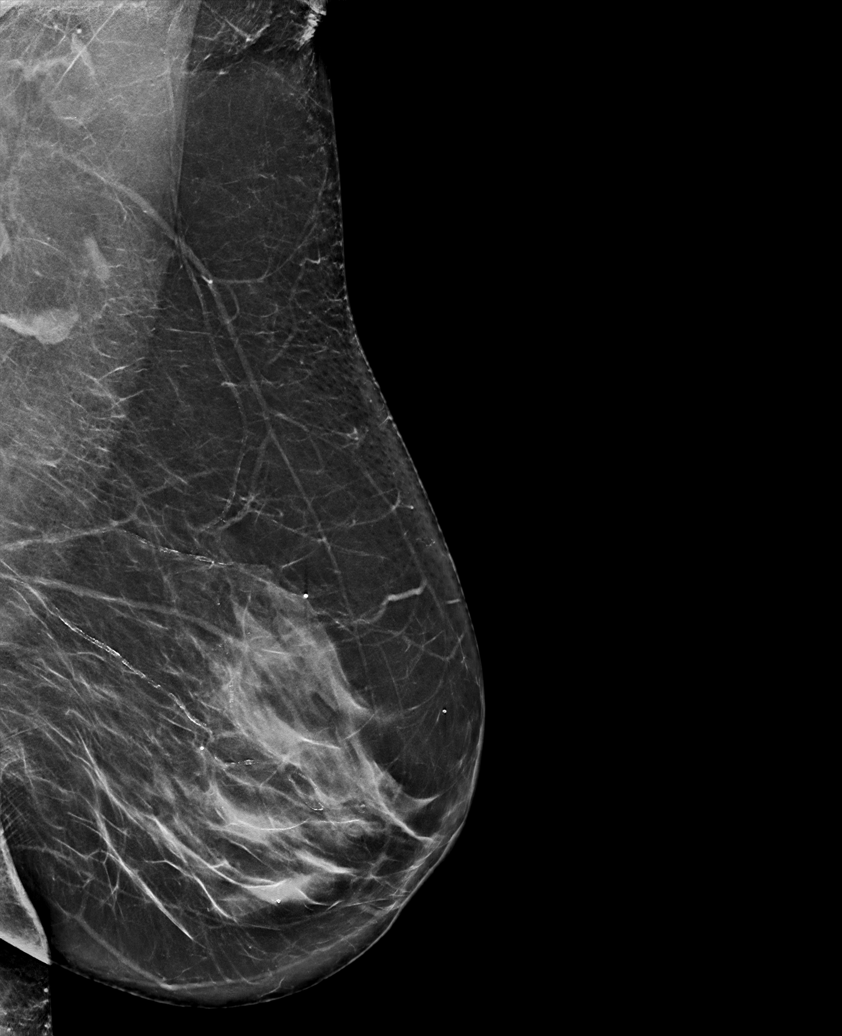

[R CC synth-2D]
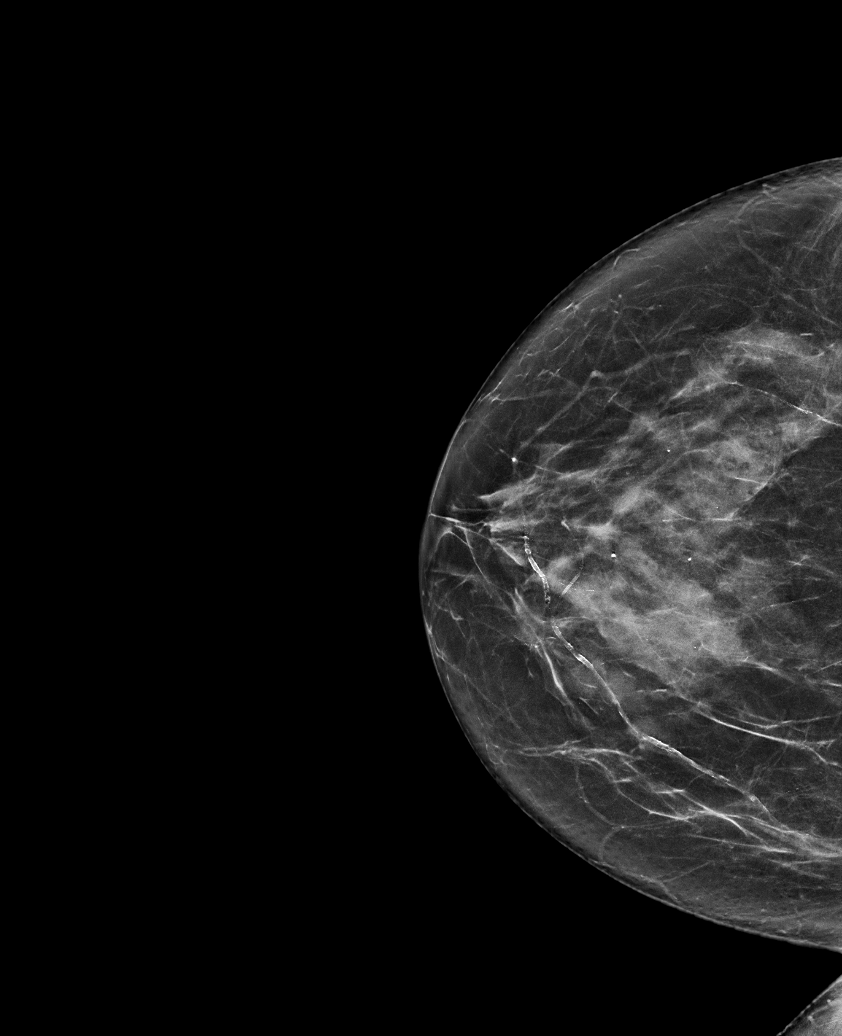

[L MLO synth-2D (2 of 2)]
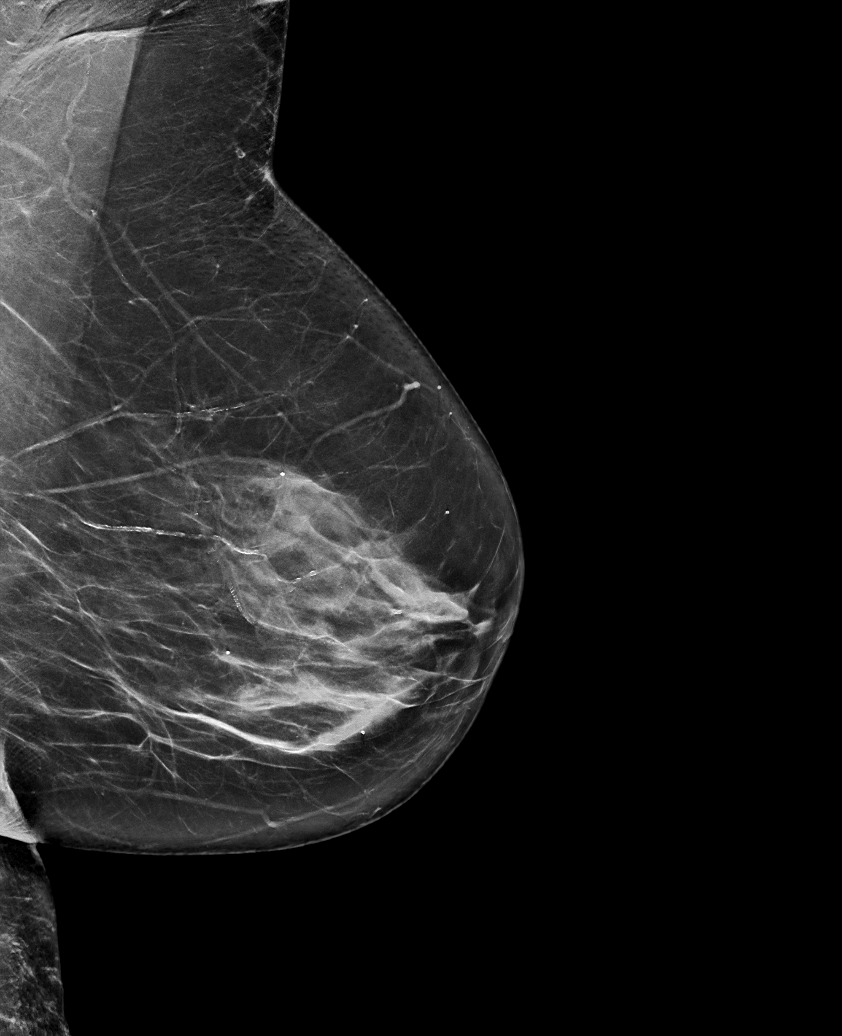

[R MLO synth-2D]
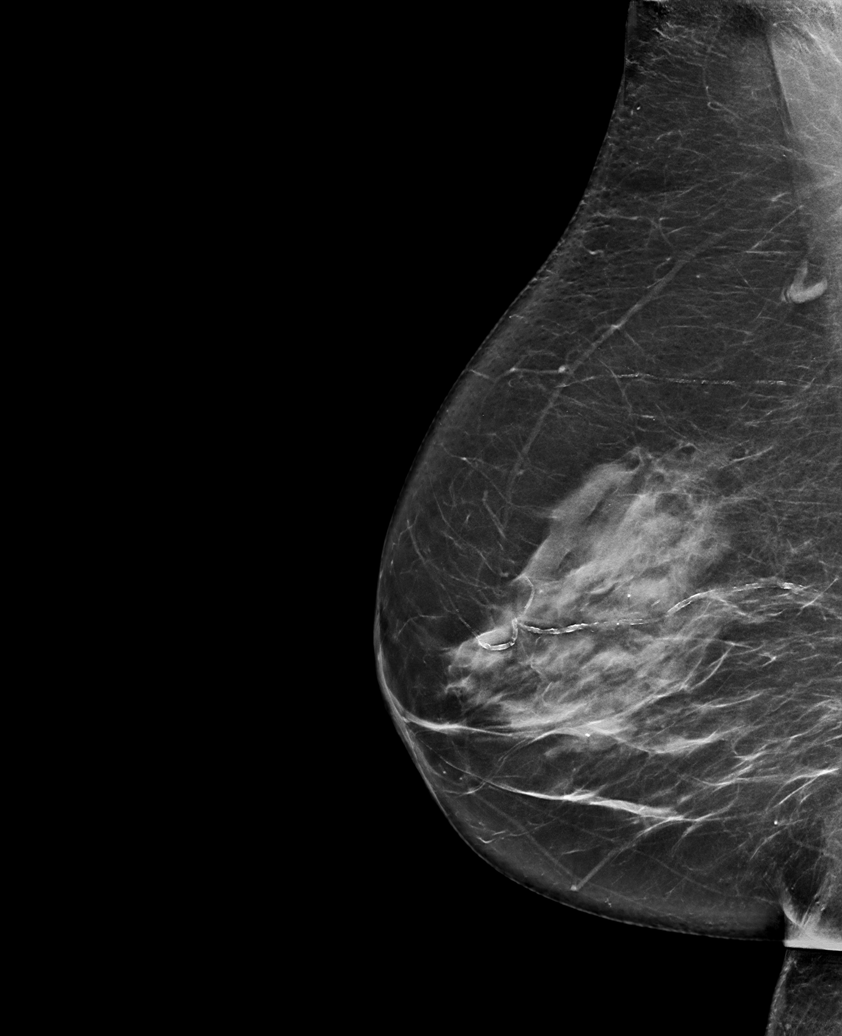

[R MLO tomo · tomo slice 41/81.0]
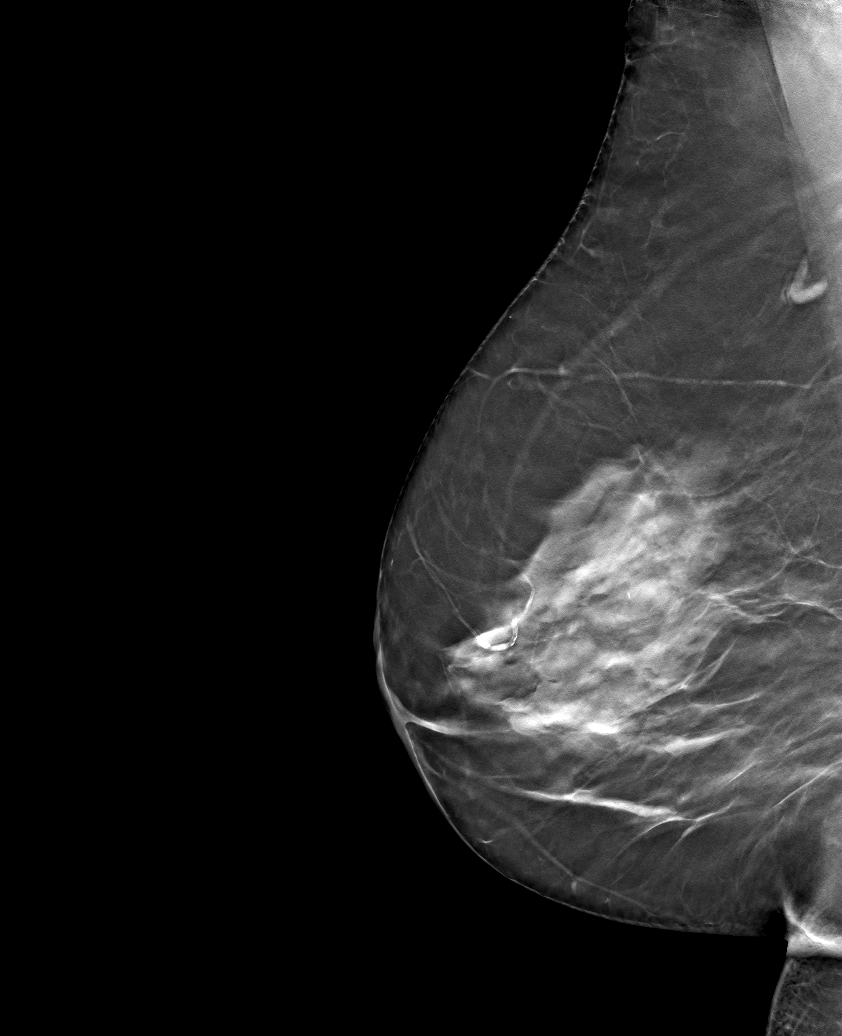

[6 of 30 positions shown; findings below may reference images not displayed]

ACR Breast Density Category c: The breast tissue is heterogeneously
dense, which may obscure small masses.
FINDINGS: There are no findings suspicious for malignancy.
IMPRESSION: No mammographic evidence of malignancy. A result letter of this
screening mammogram will be mailed directly to the patient.

RECOMMENDATION:
Screening mammogram in one year. (Code:[id number])

BI-RADS CATEGORY  1: Negative.

## 2022-05-17 DIAGNOSIS — L570 Actinic keratosis: Secondary | ICD-10-CM | POA: Diagnosis not present

## 2022-05-17 DIAGNOSIS — D2261 Melanocytic nevi of right upper limb, including shoulder: Secondary | ICD-10-CM | POA: Diagnosis not present

## 2022-05-17 DIAGNOSIS — Z85828 Personal history of other malignant neoplasm of skin: Secondary | ICD-10-CM | POA: Diagnosis not present

## 2022-05-17 DIAGNOSIS — L538 Other specified erythematous conditions: Secondary | ICD-10-CM | POA: Diagnosis not present

## 2022-05-17 DIAGNOSIS — D2272 Melanocytic nevi of left lower limb, including hip: Secondary | ICD-10-CM | POA: Diagnosis not present

## 2022-05-17 DIAGNOSIS — L820 Inflamed seborrheic keratosis: Secondary | ICD-10-CM | POA: Diagnosis not present

## 2022-05-17 DIAGNOSIS — L408 Other psoriasis: Secondary | ICD-10-CM | POA: Diagnosis not present

## 2022-08-20 DIAGNOSIS — E785 Hyperlipidemia, unspecified: Secondary | ICD-10-CM | POA: Diagnosis not present

## 2022-08-20 DIAGNOSIS — I10 Essential (primary) hypertension: Secondary | ICD-10-CM | POA: Diagnosis not present

## 2022-08-27 DIAGNOSIS — G609 Hereditary and idiopathic neuropathy, unspecified: Secondary | ICD-10-CM | POA: Diagnosis not present

## 2022-08-27 DIAGNOSIS — Z0000 Encounter for general adult medical examination without abnormal findings: Secondary | ICD-10-CM | POA: Diagnosis not present

## 2022-08-27 DIAGNOSIS — N644 Mastodynia: Secondary | ICD-10-CM | POA: Diagnosis not present

## 2022-08-27 DIAGNOSIS — R109 Unspecified abdominal pain: Secondary | ICD-10-CM | POA: Diagnosis not present

## 2022-08-27 DIAGNOSIS — I10 Essential (primary) hypertension: Secondary | ICD-10-CM | POA: Diagnosis not present

## 2022-08-27 DIAGNOSIS — Z1331 Encounter for screening for depression: Secondary | ICD-10-CM | POA: Diagnosis not present

## 2022-08-27 DIAGNOSIS — Z1211 Encounter for screening for malignant neoplasm of colon: Secondary | ICD-10-CM | POA: Diagnosis not present

## 2022-08-27 DIAGNOSIS — R059 Cough, unspecified: Secondary | ICD-10-CM | POA: Diagnosis not present

## 2022-08-27 DIAGNOSIS — E785 Hyperlipidemia, unspecified: Secondary | ICD-10-CM | POA: Diagnosis not present

## 2022-08-30 ENCOUNTER — Other Ambulatory Visit: Payer: Self-pay | Admitting: Family Medicine

## 2022-08-30 DIAGNOSIS — N644 Mastodynia: Secondary | ICD-10-CM

## 2022-09-03 DIAGNOSIS — Z1211 Encounter for screening for malignant neoplasm of colon: Secondary | ICD-10-CM | POA: Diagnosis not present

## 2022-09-04 ENCOUNTER — Other Ambulatory Visit: Payer: Self-pay

## 2022-09-04 DIAGNOSIS — I10 Essential (primary) hypertension: Secondary | ICD-10-CM | POA: Insufficient documentation

## 2022-09-04 DIAGNOSIS — E785 Hyperlipidemia, unspecified: Secondary | ICD-10-CM | POA: Insufficient documentation

## 2022-09-06 ENCOUNTER — Encounter: Payer: Self-pay | Admitting: Physician Assistant

## 2022-09-06 ENCOUNTER — Ambulatory Visit: Payer: Medicare HMO | Admitting: Physician Assistant

## 2022-09-06 VITALS — BP 139/81 | HR 80 | Temp 97.9°F | Ht 64.25 in | Wt 183.8 lb

## 2022-09-06 DIAGNOSIS — R1032 Left lower quadrant pain: Secondary | ICD-10-CM | POA: Diagnosis not present

## 2022-09-06 DIAGNOSIS — Z8541 Personal history of malignant neoplasm of cervix uteri: Secondary | ICD-10-CM

## 2022-09-06 DIAGNOSIS — R1031 Right lower quadrant pain: Secondary | ICD-10-CM | POA: Diagnosis not present

## 2022-09-06 DIAGNOSIS — R109 Unspecified abdominal pain: Secondary | ICD-10-CM | POA: Diagnosis not present

## 2022-09-06 NOTE — Patient Instructions (Addendum)
CT scheduled 09/11/22 @ 3:45 arrival. Nothing to eat after 12:00 noon.  Outpatient Imaging  [address] Nicholes Rough 

## 2022-09-06 NOTE — Progress Notes (Signed)
Celso Amy, PA-C [address]  [address]  [place], [place] [address]  Main: [phone]  Fax: [phone]   Gastroenterology Consultation  Referring Provider:     Jerl Mina, MD Primary Care Physician:  Jerl Mina, MD Primary Gastroenterologist:  Celso Amy, PA-C / Dr. Wyline Mood  Reason for Consultation:     Abdominal Pain        HPI:   Danielle Armstrong is a 77 y.o. y/o female referred for consultation & management  by Jerl Mina, MD.  Has had intermittent lower abdominal pain for 3 years.  Lower abdominal pain is becoming more frequent and severe in the past few months.  If she bends over she notices increased left lower quadrant and right lower quadrant abdominal pain.  Seems to be worse with movement.  Typically last a few minutes and improves.  Worse in the left lower quadrant.  She denies diarrhea, constipation, or unintentional weight loss.  Has had some weight gain.  She rarely sees bright red blood on the tissue which she attributes to hemorrhoids.  She recently completed a Cologuard test last week, results are pending.  She reports having previous hemorrhoidectomy.  She had a negative colonoscopy 04/2013.  Had severe diarrhea while doing the colonoscopy prep.  She refuses to do any more colonoscopies.  She denies family history of colon cancer.  She has history of cervical cancer diagnosed in 1992.  Had total abdominal hysterectomy with BSO.  Did not have chemo or radiation.  Labs 08/20/2022 showed normal CBC with WBC 5.8, hemoglobin 14.8.  Normal CMP.  No recent abdominal imaging.  Past Medical History:  Diagnosis Date   Barretts esophagus    Cancer (HCC) 1992   cervical   Epilepsy (HCC)    nonoe in years   GERD (gastroesophageal reflux disease)    Glaucoma    HLD (hyperlipidemia)    HTN (hypertension)    Irregular heart rate    per patient   Migraines    Neuropathy    Osteoarthritis    Osteoporosis    PONV (postoperative nausea and  vomiting)    hospitalized x 1 wk unable to keep anything down orally 1992 AFTER HYSTERECTOMY   Seizures (HCC)    none in years   Umbilical hernia     Past Surgical History:  Procedure Laterality Date   ABDOMINAL HYSTERECTOMY     w/oophorectomy   basal cyst removed Left 12/2020   cyst removed from LEft knee   BREAST EXCISIONAL BIOPSY Right 1987   neg   CARPAL TUNNEL RELEASE Right    CATARACT EXTRACTION W/PHACO Left 03/22/2021   Procedure: CATARACT EXTRACTION PHACO AND INTRAOCULAR LENS PLACEMENT (IOC) LEFT 8.57 00:55.7;  Surgeon: Lockie Mola, MD;  Location: [facility];  Service: Ophthalmology;  Laterality: Left;   CATARACT EXTRACTION W/PHACO Right 04/05/2021   Procedure: CATARACT EXTRACTION PHACO AND INTRAOCULAR LENS PLACEMENT (IOC) RIGHT 7.40 00:59.9;  Surgeon: Lockie Mola, MD;  Location: [facility];  Service: Ophthalmology;  Laterality: Right;   COLONOSCOPY     dental extractions     HEMORRHOID SURGERY     KNEE ARTHROSCOPY Left    meniscus repair   PILONIDAL CYST / SINUS EXCISION     SHOULDER ARTHROSCOPY WITH SUBACROMIAL DECOMPRESSION AND BICEP TENDON REPAIR Right 06/26/2018   Procedure: SHOULDER ARTHROSCOPY WITH DEBRIDEMENT, DECOMPRESSION, POSSIBLE ROTATOR CUFF REPAIR AND BICEP TENODESIS-RIGHT;  Surgeon: Christena Flake, MD;  Location: [facility];  Service: Orthopedics;  Laterality: Right;  SHOULDER ARTHROSCOPY WITH SUBACROMIAL DECOMPRESSION, ROTATOR CUFF REPAIR AND BICEP TENDON REPAIR Left 08/17/2021   Procedure: LEFT SHOULDER ARTHROSCOPY WITH DEBRIDEMENT, DECOMPRESSION, TENOLYSIS LEFT INDEX AND LONG TRIGGER FINGERS.;  Surgeon: Christena Flake, MD;  Location: [facility];  Service: Orthopedics;  Laterality: Left;   TENDON REPAIR     right hand   TONSILLECTOMY     TRIGGER FINGER RELEASE Right 03/02/2020   Procedure: RELEASE TRIGGER FINGER/A-1 PULLEY;  Surgeon: Christena Flake, MD;  Location: [facility];  Service: Orthopedics;  Laterality: Right;    Prior  to Admission medications   Medication Sig Start Date End Date Taking? Authorizing Provider  Alpha Lipoic Acid 200 MG CAPS Take 200 mg by mouth 2 (two) times daily.    [provider]  Aspirin (VAZALORE) 81 MG CAPS Take 81 mg by mouth daily.    [provider]  cetirizine (ZYRTEC) 10 MG tablet Take 10 mg by mouth every morning.     [provider]  chlorpheniramine (CHLOR-TRIMETON) 4 MG tablet Take 4 mg by mouth daily as needed for allergies.    [provider]  Ginkgo Biloba 60 MG TABS Take 60 mg by mouth 2 (two) times daily.    [provider]  Glycerin-Hypromellose-PEG 400 (DRY EYE RELIEF DROPS) 0.2-0.2-1 % SOLN Place 1 drop into both eyes daily as needed (eye irritation).    [provider]  Inulin (FIBER CHOICE PO) Take 1 tablet by mouth at bedtime. Gummies    [provider]  metoprolol tartrate (LOPRESSOR) 25 MG tablet Take 25 mg by mouth 2 (two) times daily. 04/22/18   [provider]  Misc Natural Products (NEURIVA) CHEW Chew 2 tablets by mouth every evening.    [provider]  Nutritional Supplements (FRUIT & VEGETABLE DAILY PO) Take 3 capsules by mouth every evening. Juice Plus Fruit + Berry + Vegetable (3 capsule total daily)    [provider]  pantoprazole (PROTONIX) 40 MG tablet Take 40 mg by mouth in the morning and at bedtime. 12/07/19   [provider]  tacrolimus (PROTOPIC) 0.1 % ointment  05/17/22   [provider]  triamcinolone cream (KENALOG) 0.1 %  05/17/22   [provider]    Family History  Problem Relation Age of Onset   Breast cancer Mother 53     Social History   Tobacco Use   Smoking status: Former    Packs/day: 0.25    Years: 3.00    Additional pack years: 0.00    Total pack years: 0.75    Types: Cigarettes    Quit date: 1990    Years since quitting: 34.3   Smokeless tobacco: Never  Vaping Use   Vaping Use: Never used  Substance Use  Topics   Alcohol use: Never   Drug use: Never    Allergies as of 09/06/2022   (No Known Allergies)    Review of Systems:    All systems reviewed and negative except where noted in HPI.   Physical Exam:  BP 139/81   Pulse 80   Temp 97.9 F (36.6 C)   Ht 5' 4.25" (1.632 m)   Wt 183 lb 12.8 oz (83.4 kg)   BMI 31.30 kg/m  No LMP recorded. Patient has had a hysterectomy. Psych:  Alert and cooperative. Normal mood and affect. General:   Alert,  Well-developed, well-nourished, pleasant and cooperative in NAD Head:  Normocephalic and atraumatic. Eyes:  Sclera clear, no icterus.   Conjunctiva pink. Neck:  Supple; no masses or thyromegaly. Lungs:  Respirations even and unlabored.  Clear throughout to auscultation.   No wheezes, crackles, or rhonchi. No acute distress. Heart:  Regular rate and rhythm; no murmurs, clicks, rubs, or gallops. Abdomen:  Normal bowel sounds.  No bruits.  Soft, and non-distended without masses, hepatosplenomegaly or hernias noted.  There is mild to moderate LLQ tenderness.  Rest of abdomen is not tender.  No RLQ tenderness.  No guarding or rebound tenderness.    Neurologic:  Alert and oriented x3;  grossly normal neurologically. Psych:  Alert and cooperative. Normal mood and affect.  Imaging Studies: No results found.  Assessment and Plan:   Danielle Armstrong is a 77 y.o. y/o female has been referred for Lower Abdominal Pain in the LLQ and RLQ.  Moderate Tenderness in the LLQ on exam today.  Remote History of Cervical Cancer.  Had total abdominal hysterectomy with BSO.  LLQ Abdominal Pain with Tenderness - Rule Out Diverticulitis RLQ Abdominal Pain History of Cervical Cancer s/p TAH and BSO   Scheduling Abdominal / Pelvic CT with Contrast for further Evaluation.  Rule Out Diverticulitis or Cancer.   Follow up in 4 weeks with TG.  Celso Amy, PA-C

## 2022-09-07 ENCOUNTER — Ambulatory Visit
Admission: RE | Admit: 2022-09-07 | Discharge: 2022-09-07 | Disposition: A | Discharge status: Home / Self Care | Payer: Medicare HMO | Source: Ambulatory Visit | Attending: Family Medicine | Admitting: Family Medicine

## 2022-09-07 ENCOUNTER — Telehealth: Payer: Self-pay

## 2022-09-07 DIAGNOSIS — N644 Mastodynia: Secondary | ICD-10-CM | POA: Insufficient documentation

## 2022-09-07 NOTE — Telephone Encounter (Signed)
Spoke with patient and insurance denied having CT scan at Outpatient Imaging- New Tuesday May 28 @ 3:45 at [facility].

## 2022-09-07 NOTE — Telephone Encounter (Signed)
Patient insurance requires her to go to [facility] instead of out patient imaging because out patient imaging is out of network for her CT scan. CT scan is approved for [facility]. Can you please get patient reschedule to [facility].          [employer] Confirmation Number for Exam Scheduling Attention: [facility] Confirmation Date: Sep 11 2022 - Oct 11 2022 Member ID Number: Patient Name: Patient Phone Number: Patient date of Birth: [id number] Danielle Armstrong [id number] 30-Jul-1945 Ordering Physician: Physician Phone: Celso Amy [phone] Facility: Facility Phone: [facility] [phone] [employer] Number: [id number] Appointment Date: 09/11/2022 Procedure: 74177 CT ABDOMEN PELVIS W DYE, 74176 CT ABDOMEN PELVIS WO DYE, 74178 CT ABDOMEN PELVIS WO&WDYE ONE OR BOTH REGIONS Diagnosis: R10.32 Left lower quadrant pain       This procedure has been requested by Ordering Physician: Celso Amy for the above patient.  Please note that this form does not represent a guarantee of payment.  If you have any questions regarding this confirmation notice, please call [phone] or fax [place] at [phone]. REMINDER: Please ensure you are entering the correct fax number or that the correct fax number is programmed in your system prior to sending a fax to avoid HIPAA privacy incidents.    This document is confidential and is intended solely for the use of the individual or entity to which it is addressed. This communication may contain personally identifiable health information, which is subject to the various state and federal laws governing the health information contained herein. If the reader of this message is not the intended recipient or an employee or agent responsible for delivering this message, he or she is hereby notified that he or she has received this communication and documents in error and that any dissemination,  distribution, or copying of this communication is strictly prohibited.  If you received this fax in error, please write an explanation on it, including your contact information and fax back to [phone] then destroy this document. Thank you.

## 2022-09-09 LAB — EXTERNAL GENERIC LAB PROCEDURE: COLOGUARD: NEGATIVE

## 2022-09-09 LAB — COLOGUARD: COLOGUARD: NEGATIVE

## 2022-09-11 ENCOUNTER — Ambulatory Visit: Payer: Medicare HMO

## 2022-09-18 ENCOUNTER — Ambulatory Visit
Admission: RE | Admit: 2022-09-18 | Discharge: 2022-09-18 | Disposition: A | Discharge status: Home / Self Care | Payer: Medicare HMO | Source: Ambulatory Visit | Attending: Physician Assistant | Admitting: Physician Assistant

## 2022-09-18 DIAGNOSIS — R109 Unspecified abdominal pain: Secondary | ICD-10-CM | POA: Insufficient documentation

## 2022-09-18 DIAGNOSIS — R1030 Lower abdominal pain, unspecified: Secondary | ICD-10-CM | POA: Diagnosis not present

## 2022-09-18 DIAGNOSIS — I700 Atherosclerosis of aorta: Secondary | ICD-10-CM | POA: Diagnosis not present

## 2022-09-18 MED ORDER — IOHEXOL 300 MG/ML  SOLN
100.0000 mL | Freq: Once | INTRAMUSCULAR | Status: AC | PRN
  Administered 2022-09-18: 100 mL via INTRAVENOUS

## 2022-09-24 ENCOUNTER — Telehealth: Payer: Self-pay

## 2022-09-24 DIAGNOSIS — K769 Liver disease, unspecified: Secondary | ICD-10-CM

## 2022-09-24 NOTE — Progress Notes (Signed)
Notify patient: Abdominal pelvic CT shows no acute abnormality to explain abdominal pain.  There is diverticulosis, but No evidence of diverticulitis.  Incidental findings include small hiatal hernia, small fat-containing umbilical hernia, and 1.4 cm benign appearing lesion in the right liver lobe.  **Please schedule abdominal MRI with and without contrast to further evaluate small liver lesion (most likely benign).

## 2022-09-24 NOTE — Telephone Encounter (Signed)
Patient notified.   MRI w/wo  scheduled 09-27-22 @ 10:30 [facility]. NPO 4 hours     Notify patient: Abdominal pelvic CT shows no acute abnormality to explain abdominal pain.  There is diverticulosis, but No evidence of diverticulitis.  Incidental findings include small hiatal hernia, small fat-containing umbilical hernia, and 1.4 cm benign appearing lesion in the right liver lobe.  **Please schedule abdominal MRI with and without contrast to further evaluate small liver lesion (most likely benign).

## 2022-09-27 ENCOUNTER — Ambulatory Visit
Admission: RE | Admit: 2022-09-27 | Discharge: 2022-09-27 | Disposition: A | Discharge status: Home / Self Care | Payer: Medicare HMO | Source: Ambulatory Visit | Attending: Physician Assistant | Admitting: Physician Assistant

## 2022-09-27 DIAGNOSIS — K769 Liver disease, unspecified: Secondary | ICD-10-CM | POA: Diagnosis not present

## 2022-09-27 DIAGNOSIS — K760 Fatty (change of) liver, not elsewhere classified: Secondary | ICD-10-CM | POA: Diagnosis not present

## 2022-09-27 MED ORDER — GADOBUTROL 1 MMOL/ML IV SOLN
7.5000 mL | Freq: Once | INTRAVENOUS | Status: AC | PRN
  Administered 2022-09-27: 7.5 mL via INTRAVENOUS

## 2022-10-04 ENCOUNTER — Telehealth: Payer: Self-pay

## 2022-10-04 NOTE — Telephone Encounter (Signed)
Left message for patient to return call to office.   Notify patient abdominal MRI shows mild fatty liver.  No cirrhosis.  There is a small 9 mm benign lesion in the right liver, consistent with benign hemangioma.  Nothing worrisome.  No further evaluation is recommended.

## 2022-10-04 NOTE — Telephone Encounter (Signed)
Patient notified. Patient has appointment with Inetta Fermo 10-05-22.  Notify patient abdominal MRI shows mild fatty liver.  No cirrhosis.  There is a small 9 mm benign lesion in the right liver, consistent with benign hemangioma.  Nothing worrisome.  No further evaluation is recommended.

## 2022-10-04 NOTE — Progress Notes (Signed)
Celso Amy, PA-C [address]  [address]  [place], [place] [address]  Main: [phone]  Fax: [phone]   Primary Care Physician: Jerl Mina, MD  Primary Gastroenterologist:  Celso Amy, PA-C / Dr. Wyline Mood   CC: F/U chronic lower Abdominal Pain  HPI: Danielle Armstrong is a 77 y.o. female returns for 1 month follow-up of chronic lower abdominal pain.  Remote history of cervical cancer.  Previous total hysterectomy with BSO.  09/22/2022 - abdominal pelvic CT with contrast showed no acute findings.  Incidental diverticulosis, small hiatal hernia, small fat-containing umbilical hernia.  Incidental 1.4 cm indeterminate liver lesion.  09/27/2022 - abdominal MRI showed mild fatty liver.  No cirrhosis.  There was a small 9 mm benign lesion in the right liver, consistent with benign hemangioma.  Nothing worrisome.  No further evaluation   09/03/2022 - Negative Cologuard.  She had a negative colonoscopy 04/2013.  Had severe diarrhea while doing the colonoscopy prep.  She refuses to do any more colonoscopies.  She denies family history of colon cancer.   Labs 08/20/2022 showed normal CBC with WBC 5.8, hemoglobin 14.8.  Normal CMP.  Currently her abdominal pain has improved.  It only hurts if she bends over.  She has not had any alarm symptoms such as diarrhea, constipation, weight loss, or rectal bleeding.  No recent abdominal pain.   Current Outpatient Medications  Medication Sig Dispense Refill   Alpha Lipoic Acid 200 MG CAPS Take 200 mg by mouth 2 (two) times daily.     Aspirin (VAZALORE) 81 MG CAPS Take 81 mg by mouth daily.     cetirizine (ZYRTEC) 10 MG tablet Take 10 mg by mouth every morning.      chlorpheniramine (CHLOR-TRIMETON) 4 MG tablet Take 4 mg by mouth daily as needed for allergies.     Ginkgo Biloba 60 MG TABS Take 60 mg by mouth 2 (two) times daily.     Glycerin-Hypromellose-PEG 400 (DRY EYE RELIEF DROPS) 0.2-0.2-1 % SOLN Place 1 drop into both eyes daily as  needed (eye irritation).     Inulin (FIBER CHOICE PO) Take 1 tablet by mouth at bedtime. Gummies     metoprolol tartrate (LOPRESSOR) 25 MG tablet Take 25 mg by mouth 2 (two) times daily.     Misc Natural Products (NEURIVA) CHEW Chew 2 tablets by mouth every evening.     Nutritional Supplements (FRUIT & VEGETABLE DAILY PO) Take 3 capsules by mouth every evening. Juice Plus Fruit + Berry + Vegetable (3 capsule total daily)     pantoprazole (PROTONIX) 40 MG tablet Take 40 mg by mouth in the morning and at bedtime.     tacrolimus (PROTOPIC) 0.1 % ointment      triamcinolone cream (KENALOG) 0.1 %      No current facility-administered medications for this visit.    Allergies as of 10/05/2022   (No Known Allergies)    Past Medical History:  Diagnosis Date   Barretts esophagus    Cancer (HCC) 1992   cervical   Epilepsy (HCC)    nonoe in years   GERD (gastroesophageal reflux disease)    Glaucoma    HLD (hyperlipidemia)    HTN (hypertension)    Irregular heart rate    per patient   Migraines    Neuropathy    Osteoarthritis    Osteoporosis    PONV (postoperative nausea and vomiting)    hospitalized x 1 wk unable to keep anything down orally 1992  AFTER HYSTERECTOMY   Seizures (HCC)    none in years   Umbilical hernia     Past Surgical History:  Procedure Laterality Date   ABDOMINAL HYSTERECTOMY     w/oophorectomy   basal cyst removed Left 12/2020   cyst removed from LEft knee   BREAST EXCISIONAL BIOPSY Right 1987   neg   CARPAL TUNNEL RELEASE Right    CATARACT EXTRACTION W/PHACO Left 03/22/2021   Procedure: CATARACT EXTRACTION PHACO AND INTRAOCULAR LENS PLACEMENT (IOC) LEFT 8.57 00:55.7;  Surgeon: Lockie Mola, MD;  Location: [facility];  Service: Ophthalmology;  Laterality: Left;   CATARACT EXTRACTION W/PHACO Right 04/05/2021   Procedure: CATARACT EXTRACTION PHACO AND INTRAOCULAR LENS PLACEMENT (IOC) RIGHT 7.40 00:59.9;  Surgeon: Lockie Mola, MD;   Location: [facility];  Service: Ophthalmology;  Laterality: Right;   COLONOSCOPY     dental extractions     HEMORRHOID SURGERY     KNEE ARTHROSCOPY Left    meniscus repair   PILONIDAL CYST / SINUS EXCISION     SHOULDER ARTHROSCOPY WITH SUBACROMIAL DECOMPRESSION AND BICEP TENDON REPAIR Right 06/26/2018   Procedure: SHOULDER ARTHROSCOPY WITH DEBRIDEMENT, DECOMPRESSION, POSSIBLE ROTATOR CUFF REPAIR AND BICEP TENODESIS-RIGHT;  Surgeon: Christena Flake, MD;  Location: [facility];  Service: Orthopedics;  Laterality: Right;   SHOULDER ARTHROSCOPY WITH SUBACROMIAL DECOMPRESSION, ROTATOR CUFF REPAIR AND BICEP TENDON REPAIR Left 08/17/2021   Procedure: LEFT SHOULDER ARTHROSCOPY WITH DEBRIDEMENT, DECOMPRESSION, TENOLYSIS LEFT INDEX AND LONG TRIGGER FINGERS.;  Surgeon: Christena Flake, MD;  Location: [facility];  Service: Orthopedics;  Laterality: Left;   TENDON REPAIR     right hand   TONSILLECTOMY     TRIGGER FINGER RELEASE Right 03/02/2020   Procedure: RELEASE TRIGGER FINGER/A-1 PULLEY;  Surgeon: Christena Flake, MD;  Location: [facility];  Service: Orthopedics;  Laterality: Right;    Review of Systems:    All systems reviewed and negative except where noted in HPI.   Physical Examination:   There were no vitals taken for this visit.  General: Well-nourished, well-developed in no acute distress.  Eyes: No icterus. Conjunctivae pink. Mouth: Oropharyngeal mucosa moist and pink , no lesions erythema or exudate. Lungs: Clear to auscultation bilaterally. Non-labored. Heart: Regular rate and rhythm, no murmurs rubs or gallops.  Abdomen: Bowel sounds are normal; Abdomen is Soft; No hepatosplenomegaly, masses or hernias;  No Abdominal Tenderness; No guarding or rebound tenderness. Extremities: No lower extremity edema. No clubbing or deformities. Neuro: Alert and oriented x 3.  Grossly intact. Skin: Warm and dry, no jaundice.   Psych: Alert and cooperative, normal mood and affect.   Imaging  Studies: MR Abdomen W Wo Contrast  Result Date: 10/03/2022 CLINICAL DATA:  CT performed for lower abdominal pain demonstrating a right hepatic lobe 1.4 cm lesion. EXAM: MRI ABDOMEN WITHOUT AND WITH CONTRAST TECHNIQUE: Multiplanar multisequence MR imaging of the abdomen was performed both before and after the administration of intravenous contrast. CONTRAST:  7.47mL GADAVIST GADOBUTROL 1 MMOL/ML IV SOLN COMPARISON:  CT of 09/18/2022 FINDINGS: Portions of exam are minimally motion degraded. Lower chest: Normal heart size without pericardial or pleural effusion. Tiny hiatal hernia. Hepatobiliary: No cirrhosis. Mild hepatic steatosis. T2 hyperintense posterior right hepatic lobe (segment 7) 9 mm lesion on 09/13 corresponds to the CT abnormality. No restricted diffusion, including on 12/10. This is less well-defined on delayed post-contrast imaging, including 36/24. No other liver lesion. Normal gallbladder, without biliary ductal dilatation. Pancreas:  Normal, without mass or ductal dilatation. Spleen:  Normal  in size, without focal abnormality. Adrenals/Urinary Tract: Normal adrenal glands. Normal kidneys, without hydronephrosis. Stomach/Bowel: Normal remainder of the stomach. Normal abdominal bowel loops. Vascular/Lymphatic: Aortic atherosclerosis. No retroperitoneal or retrocrural adenopathy. Other:  No ascites. Musculoskeletal: No acute osseous abnormality. IMPRESSION: 1. 9 mm segment 7 liver lesion is favored to represent a hemangioma, given T2 hyperintensity and relative ill definition on later post-contrast images. Not diagnostic of such. If any history of primary malignancy or liver disease, recommend follow-up pre and post contrast abdominal MRI at 6 months. If no such history, specific imaging follow-up is not felt necessary. 2.  Tiny hiatal hernia. 3.  Aortic Atherosclerosis (ICD10-I70.0). 4. Mild hepatic steatosis. 5. Minimal motion degradation. Electronically Signed   By: Jeronimo Greaves M.D.   On:  10/03/2022 12:14   CT Abdomen Pelvis W Contrast  Result Date: 09/22/2022 CLINICAL DATA:  Chronic lower abdominal pain, with worsening over past several months. EXAM: CT ABDOMEN AND PELVIS WITH CONTRAST TECHNIQUE: Multidetector CT imaging of the abdomen and pelvis was performed using the standard protocol following bolus administration of intravenous contrast. RADIATION DOSE REDUCTION: This exam was performed according to the departmental dose-optimization program which includes automated exposure control, adjustment of the mA and/or kV according to patient size and/or use of iterative reconstruction technique. CONTRAST:  OMNIPAQUE IOHEXOL 300 MG/ML  SOLN COMPARISON:  None Available. FINDINGS: Lower Chest: No acute findings. Hepatobiliary: 1.4 cm nonspecific low-attenuation lesion seen in the posterior right hepatic lobe. No other liver lesions identified. Gallbladder is unremarkable. No evidence of biliary ductal dilatation. Pancreas:  No mass or inflammatory changes. Spleen: Within normal limits in size and appearance. Adrenals/Urinary Tract: No suspicious masses identified. No evidence of ureteral calculi or hydronephrosis. Stomach/Bowel: Small hiatal hernia noted. No evidence of obstruction, inflammatory process or abnormal fluid collections. Normal appendix visualized. Diverticulosis is seen mainly involving the sigmoid colon, however there is no evidence of diverticulitis. Vascular/Lymphatic: No pathologically enlarged lymph nodes. No acute vascular findings. Aortic atherosclerotic calcification incidentally noted. Reproductive: Prior hysterectomy noted. Adnexal regions are unremarkable in appearance. Other:  Small umbilical hernia is seen, which contains only fat. Musculoskeletal:  No suspicious bone lesions identified. IMPRESSION: No acute findings. Colonic diverticulosis, without radiographic evidence of diverticulitis. Small hiatal hernia. Small umbilical hernia, which contains only fat. 1.4 cm  indeterminate low-attenuation lesion in posterior right hepatic lobe. Abdomen MRI without and with contrast is recommended for further characterization. Aortic Atherosclerosis (ICD10-I70.0). Electronically Signed   By: Danae Orleans M.D.   On: 09/22/2022 13:42   MM 3D DIAGNOSTIC MAMMOGRAM BILATERAL BREAST  Result Date: 09/07/2022 CLINICAL DATA:  Patient presents complaining of waxing and waning/intermittent bilateral breast pain along the lateral aspects of both breasts. No reported lumps. EXAM: DIGITAL DIAGNOSTIC BILATERAL MAMMOGRAM WITH TOMOSYNTHESIS TECHNIQUE: Bilateral digital diagnostic mammography and breast tomosynthesis was performed. COMPARISON:  Previous exam(s). ACR Breast Density Category c: The breasts are heterogeneously dense, which may obscure small masses. FINDINGS: There are no breast masses, areas of architectural distortion, areas of significant asymmetry or suspicious calcifications. No mammographic change. IMPRESSION: No evidence of breast malignancy. RECOMMENDATION: Screening mammogram in one year.(Code:[id number]) I have discussed the findings and recommendations with the patient. If applicable, a reminder letter will be sent to the patient regarding the next appointment. BI-RADS CATEGORY  1: Negative. Electronically Signed   By: Amie Portland M.D.   On: 09/07/2022 13:27    Assessment and Plan:   Danielle Armstrong is a 77 y.o. y/o female returns for follow-up of chronic lower  abdominal pain.  Recent abdominal pelvic CT and abdominal MRI showed no acute abnormality to explain lower abdominal pain.  She has 1 small liver lesion, consistent with benign hemangioma, not worrisome.  Also incidental fatty liver.  Chronic lower abdominal pain, intermittent, currently improved.  Reassurance regarding negative abdominal pelvic CT.  Reassurance regarding negative Cologuard test.  She adamantly declined to schedule repeat colonoscopy.  Hepatic steatosis  Recommend a low-fat diet, regular  exercise, and weight loss. Patient education handout about fatty liver disease was given and discussed from up-to-date.   Small benign liver hemangioma  Reassurance.  No further workup is needed   Celso Amy, PA-C  Follow up as needed if she has recurrent lower abdominal pain.

## 2022-10-05 ENCOUNTER — Encounter: Payer: Self-pay | Admitting: Physician Assistant

## 2022-10-05 ENCOUNTER — Ambulatory Visit: Payer: Medicare HMO | Admitting: Physician Assistant

## 2022-10-05 VITALS — BP 130/77 | HR 68 | Temp 98.1°F | Ht 64.25 in | Wt 183.2 lb

## 2022-10-05 DIAGNOSIS — R1084 Generalized abdominal pain: Secondary | ICD-10-CM | POA: Diagnosis not present

## 2022-10-05 DIAGNOSIS — K760 Fatty (change of) liver, not elsewhere classified: Secondary | ICD-10-CM | POA: Diagnosis not present

## 2022-11-09 DIAGNOSIS — H35372 Puckering of macula, left eye: Secondary | ICD-10-CM | POA: Diagnosis not present

## 2022-11-09 DIAGNOSIS — Z961 Presence of intraocular lens: Secondary | ICD-10-CM | POA: Diagnosis not present

## 2022-11-09 DIAGNOSIS — H43813 Vitreous degeneration, bilateral: Secondary | ICD-10-CM | POA: Diagnosis not present

## 2023-01-07 DIAGNOSIS — M65341 Trigger finger, right ring finger: Secondary | ICD-10-CM | POA: Diagnosis not present

## 2023-01-07 DIAGNOSIS — M65351 Trigger finger, right little finger: Secondary | ICD-10-CM | POA: Diagnosis not present

## 2023-01-07 DIAGNOSIS — M7581 Other shoulder lesions, right shoulder: Secondary | ICD-10-CM | POA: Diagnosis not present

## 2023-02-12 DIAGNOSIS — H6011 Cellulitis of right external ear: Secondary | ICD-10-CM | POA: Diagnosis not present

## 2023-02-13 ENCOUNTER — Other Ambulatory Visit: Payer: Self-pay | Admitting: Surgery

## 2023-02-14 ENCOUNTER — Encounter
Admission: RE | Admit: 2023-02-14 | Discharge: 2023-02-14 | Disposition: A | Discharge status: Home / Self Care | Payer: Medicare HMO | Source: Ambulatory Visit | Attending: Surgery | Admitting: Surgery

## 2023-02-14 VITALS — Ht 64.0 in | Wt 181.0 lb

## 2023-02-14 DIAGNOSIS — Z01812 Encounter for preprocedural laboratory examination: Secondary | ICD-10-CM

## 2023-02-14 DIAGNOSIS — I10 Essential (primary) hypertension: Secondary | ICD-10-CM

## 2023-02-14 DIAGNOSIS — Z01810 Encounter for preprocedural cardiovascular examination: Secondary | ICD-10-CM

## 2023-02-14 DIAGNOSIS — H6010 Cellulitis of external ear, unspecified ear: Secondary | ICD-10-CM

## 2023-02-14 HISTORY — DX: Personal history of other diseases of the digestive system: Z87.19

## 2023-02-14 HISTORY — DX: Trigger finger, unspecified finger: M65.30

## 2023-02-14 HISTORY — DX: Cellulitis of external ear, unspecified ear: H60.10

## 2023-02-14 HISTORY — DX: Diverticulosis of intestine, part unspecified, without perforation or abscess without bleeding: K57.90

## 2023-02-14 HISTORY — DX: Presence of dental prosthetic device (complete) (partial): K08.109

## 2023-02-14 HISTORY — DX: Fatty (change of) liver, not elsewhere classified: K76.0

## 2023-02-14 HISTORY — DX: Palpitations: R00.2

## 2023-02-14 HISTORY — DX: Hemangioma unspecified site: D18.00

## 2023-02-14 NOTE — Patient Instructions (Addendum)
Your procedure is scheduled on:02-20-23 Wednesday Report to the Registration Desk on the 1st floor of the [facility].Then proceed to the 2nd floor Surgery Desk To find out your arrival time, please call [phone] between 1PM - 3PM on:02-19-23 Tuesday If your arrival time is 6:00 am, do not arrive before that time as the [facility] entrance doors do not open until 6:00 am.  REMEMBER: Instructions that are not followed completely may result in serious medical risk, up to and including death; or upon the discretion of your surgeon and anesthesiologist your surgery may need to be rescheduled.  Do not eat food after midnight the night before surgery.  No gum chewing or hard candies.  You may however, drink CLEAR liquids up to 2 hours before you are scheduled to arrive for your surgery. Do not drink anything within 2 hours of your scheduled arrival time.  Clear liquids include: - water  - apple juice without pulp - gatorade (not RED colors) - black coffee or tea (Do NOT add milk or creamers to the coffee or tea) Do NOT drink anything that is not on this list.  In addition, your doctor has ordered for you to drink the provided:  Ensure Pre-Surgery Clear Carbohydrate Drink  Gatorade G2 Drinking this carbohydrate drink up to two hours before surgery helps to reduce insulin resistance and improve patient outcomes. Please complete drinking 2 hours before scheduled arrival time.  One week prior to surgery: Stop Anti-inflammatories (NSAIDS) such as Advil, Aleve, Ibuprofen, Motrin, Naproxen, Naprosyn and Aspirin based products such as Excedrin, Goody's Powder, BC Powder. Stop ANY OVER THE COUNTER supplements until after surgery (Alpha Lipoic Acid, Ginkgo Bilobam Fiber Prebiotic, Neuriva, Fruit and Vegetable)  You may however, continue to take Tylenol if needed for pain up until the day of surgery.  Stop your 81 mg Aspirin NOW (02-14-23)  Continue taking all of your other prescription  medications up until the day of surgery.  ON THE DAY OF SURGERY ONLY TAKE THESE MEDICATIONS WITH SIPS OF WATER: -metoprolol tartrate (LOPRESSOR)  -pantoprazole (PROTONIX)  -cetirizine (ZYRTEC)   No Alcohol for 24 hours before or after surgery.  No Smoking including e-cigarettes for 24 hours before surgery.  No chewable tobacco products for at least 6 hours before surgery.  No nicotine patches on the day of surgery.  Do not use any "recreational" drugs for at least a week (preferably 2 weeks) before your surgery.  Please be advised that the combination of cocaine and anesthesia may have negative outcomes, up to and including death. If you test positive for cocaine, your surgery will be cancelled.  On the morning of surgery brush your teeth with toothpaste and water, you may rinse your mouth with mouthwash if you wish. Do not swallow any toothpaste or mouthwash.  Use CHG Soap as directed on instruction sheet.  Do not wear jewelry, make-up, hairpins, clips or nail polish.  For welded (permanent) jewelry: bracelets, anklets, waist bands, etc.  Please have this removed prior to surgery.  If it is not removed, there is a chance that hospital personnel will need to cut it off on the day of surgery.  Do not wear lotions, powders, or perfumes.   Do not shave body hair from the neck down 48 hours before surgery.  Contact lenses, hearing aids and dentures may not be worn into surgery.  Do not bring valuables to the hospital. [facility] is not responsible for any missing/lost belongings or valuables.   Notify your  doctor if there is any change in your medical condition (cold, fever, infection).  Wear comfortable clothing (specific to your surgery type) to the hospital.  After surgery, you can help prevent lung complications by doing breathing exercises.  Take deep breaths and cough every 1-2 hours. Your doctor may order a device called an Incentive Spirometer to help you take deep  breaths. When coughing or sneezing, hold a pillow firmly against your incision with both hands. This is called "splinting." Doing this helps protect your incision. It also decreases belly discomfort.  If you are being admitted to the hospital overnight, leave your suitcase in the car. After surgery it may be brought to your room.  In case of increased patient census, it may be necessary for you, the patient, to continue your postoperative care in the Same Day Surgery department.  If you are being discharged the day of surgery, you will not be allowed to drive home. You will need a responsible individual to drive you home and stay with you for 24 hours after surgery.   If you are taking public transportation, you will need to have a responsible individual with you.  Please call the Pre-admissions Testing Dept. at [phone] if you have any questions about these instructions.  Surgery Visitation Policy:  Patients having surgery or a procedure may have two visitors.  Children under the age of 88 must have an adult with them who is not the patient.     Preparing for Surgery with CHLORHEXIDINE GLUCONATE (CHG) Soap  Chlorhexidine Gluconate (CHG) Soap  o An antiseptic cleaner that kills germs and bonds with the skin to continue killing germs even after washing  o Used for showering the night before surgery and morning of surgery  Before surgery, you can play an important role by reducing the number of germs on your skin.  CHG (Chlorhexidine gluconate) soap is an antiseptic cleanser which kills germs and bonds with the skin to continue killing germs even after washing.  Please do not use if you have an allergy to CHG or antibacterial soaps. If your skin becomes reddened/irritated stop using the CHG.  1. Shower the NIGHT BEFORE SURGERY and the MORNING OF SURGERY with CHG soap.  2. If you choose to wash your hair, wash your hair first as usual with your normal shampoo.  3. After  shampooing, rinse your hair and body thoroughly to remove the shampoo.  4. Use CHG as you would any other liquid soap. You can apply CHG directly to the skin and wash gently with a scrungie or a clean washcloth.  5. Apply the CHG soap to your body only from the neck down. Do not use on open wounds or open sores. Avoid contact with your eyes, ears, mouth, and genitals (private parts). Wash face and genitals (private parts) with your normal soap.  6. Wash thoroughly, paying special attention to the area where your surgery will be performed.  7. Thoroughly rinse your body with warm water.  8. Do not shower/wash with your normal soap after using and rinsing off the CHG soap.  9. Pat yourself dry with a clean towel.  10. Wear clean pajamas to bed the night before surgery.  12. Place clean sheets on your bed the night of your first shower and do not sleep with pets.  13. Shower again with the CHG soap on the day of surgery prior to arriving at the hospital.  14. Do not apply any deodorants/lotions/powders.  15. Please wear  clean clothes to the hospital.  How to Use an Incentive Spirometer An incentive spirometer is a tool that measures how well you are filling your lungs with each breath. Learning to take long, deep breaths using this tool can help you keep your lungs clear and active. This may help to reverse or lessen your chance of developing breathing (pulmonary) problems, especially infection. You may be asked to use a spirometer: After a surgery. If you have a lung problem or a history of smoking. After a long period of time when you have been unable to move or be active. If the spirometer includes an indicator to show the highest number that you have reached, your health care provider or respiratory therapist will help you set a goal. Keep a log of your progress as told by your health care provider. What are the risks? Breathing too quickly may cause dizziness or cause you to pass  out. Take your time so you do not get dizzy or light-headed. If you are in pain, you may need to take pain medicine before doing incentive spirometry. It is harder to take a deep breath if you are having pain. How to use your incentive spirometer  Sit up on the edge of your bed or on a chair. Hold the incentive spirometer so that it is in an upright position. Before you use the spirometer, breathe out normally. Place the mouthpiece in your mouth. Make sure your lips are closed tightly around it. Breathe in slowly and as deeply as you can through your mouth, causing the piston or the ball to rise toward the top of the chamber. Hold your breath for 3-5 seconds, or for as long as possible. If the spirometer includes a coach indicator, use this to guide you in breathing. Slow down your breathing if the indicator goes above the marked areas. Remove the mouthpiece from your mouth and breathe out normally. The piston or ball will return to the bottom of the chamber. Rest for a few seconds, then repeat the steps 10 or more times. Take your time and take a few normal breaths between deep breaths so that you do not get dizzy or light-headed. Do this every 1-2 hours when you are awake. If the spirometer includes a goal marker to show the highest number you have reached (best effort), use this as a goal to work toward during each repetition. After each set of 10 deep breaths, cough a few times. This will help to make sure that your lungs are clear. If you have an incision on your chest or abdomen from surgery, place a pillow or a rolled-up towel firmly against the incision when you cough. This can help to reduce pain while taking deep breaths and coughing. General tips When you are able to get out of bed: Walk around often. Continue to take deep breaths and cough in order to clear your lungs. Keep using the incentive spirometer until your health care provider says it is okay to stop using it. If you have  been in the hospital, you may be told to keep using the spirometer at home. Contact a health care provider if: You are having difficulty using the spirometer. You have trouble using the spirometer as often as instructed. Your pain medicine is not giving enough relief for you to use the spirometer as told. You have a fever. Get help right away if: You develop shortness of breath. You develop a cough with bloody mucus from the lungs. You have  fluid or blood coming from an incision site after you cough. Summary An incentive spirometer is a tool that can help you learn to take long, deep breaths to keep your lungs clear and active. You may be asked to use a spirometer after a surgery, if you have a lung problem or a history of smoking, or if you have been inactive for a long period of time. Use your incentive spirometer as instructed every 1-2 hours while you are awake. If you have an incision on your chest or abdomen, place a pillow or a rolled-up towel firmly against your incision when you cough. This will help to reduce pain. Get help right away if you have shortness of breath, you cough up bloody mucus, or blood comes from your incision when you cough. This information is not intended to replace advice given to you by your health care provider. Make sure you discuss any questions you have with your health care provider. Document Revised: 06/29/2019 Document Reviewed: 06/29/2019 Elsevier Patient Education  2024 [employer].

## 2023-02-15 ENCOUNTER — Emergency Department
Admission: EM | Admit: 2023-02-15 | Discharge: 2023-02-15 | Disposition: A | Discharge status: Home / Self Care | Payer: Medicare HMO | Attending: Emergency Medicine | Admitting: Emergency Medicine

## 2023-02-15 ENCOUNTER — Other Ambulatory Visit: Payer: Self-pay

## 2023-02-15 ENCOUNTER — Emergency Department: Payer: Medicare HMO

## 2023-02-15 DIAGNOSIS — K429 Umbilical hernia without obstruction or gangrene: Secondary | ICD-10-CM | POA: Diagnosis not present

## 2023-02-15 DIAGNOSIS — R519 Headache, unspecified: Secondary | ICD-10-CM | POA: Diagnosis present

## 2023-02-15 DIAGNOSIS — M799 Soft tissue disorder, unspecified: Secondary | ICD-10-CM | POA: Diagnosis not present

## 2023-02-15 DIAGNOSIS — D1803 Hemangioma of intra-abdominal structures: Secondary | ICD-10-CM | POA: Diagnosis not present

## 2023-02-15 DIAGNOSIS — L03211 Cellulitis of face: Secondary | ICD-10-CM | POA: Diagnosis not present

## 2023-02-15 DIAGNOSIS — K2900 Acute gastritis without bleeding: Secondary | ICD-10-CM | POA: Insufficient documentation

## 2023-02-15 DIAGNOSIS — K5730 Diverticulosis of large intestine without perforation or abscess without bleeding: Secondary | ICD-10-CM | POA: Diagnosis not present

## 2023-02-15 DIAGNOSIS — R000 Tachycardia, unspecified: Secondary | ICD-10-CM | POA: Diagnosis not present

## 2023-02-15 DIAGNOSIS — K769 Liver disease, unspecified: Secondary | ICD-10-CM | POA: Diagnosis not present

## 2023-02-15 LAB — CBC
HCT: 46.6 % — ABNORMAL HIGH (ref 36.0–46.0)
Hemoglobin: 15.7 g/dL — ABNORMAL HIGH (ref 12.0–15.0)
MCH: 30.8 pg (ref 26.0–34.0)
MCHC: 33.7 g/dL (ref 30.0–36.0)
MCV: 91.6 fL (ref 80.0–100.0)
Platelets: 295 10*3/uL (ref 150–400)
RBC: 5.09 MIL/uL (ref 3.87–5.11)
RDW: 13.2 % (ref 11.5–15.5)
WBC: 8.6 10*3/uL (ref 4.0–10.5)
nRBC: 0 % (ref 0.0–0.2)

## 2023-02-15 LAB — COMPREHENSIVE METABOLIC PANEL
ALT: 23 U/L (ref 0–44)
AST: 29 U/L (ref 15–41)
Albumin: 4 g/dL (ref 3.5–5.0)
Alkaline Phosphatase: 72 U/L (ref 38–126)
Anion gap: 11 (ref 5–15)
BUN: 14 mg/dL (ref 8–23)
CO2: 23 mmol/L (ref 22–32)
Calcium: 9.3 mg/dL (ref 8.9–10.3)
Chloride: 103 mmol/L (ref 98–111)
Creatinine, Ser: 0.86 mg/dL (ref 0.44–1.00)
GFR, Estimated: >60 mL/min (ref >60)
Glucose, Bld: 132 mg/dL — ABNORMAL HIGH (ref 70–99)
Potassium: 3.6 mmol/L (ref 3.5–5.1)
Sodium: 137 mmol/L (ref 135–145)
Total Bilirubin: 0.6 mg/dL (ref 0.3–1.2)
Total Protein: 7.1 g/dL (ref 6.5–8.1)

## 2023-02-15 LAB — LIPASE, BLOOD: Lipase: 38 U/L (ref 11–51)

## 2023-02-15 LAB — URINALYSIS, ROUTINE W REFLEX MICROSCOPIC
Bilirubin Urine: NEGATIVE
Glucose, UA: NEGATIVE mg/dL
Hgb urine dipstick: NEGATIVE
Ketones, ur: 5 mg/dL — AB
Leukocytes,Ua: NEGATIVE
Nitrite: NEGATIVE
Protein, ur: NEGATIVE mg/dL
Specific Gravity, Urine: 1.016 (ref 1.005–1.030)
pH: 5 (ref 5.0–8.0)

## 2023-02-15 MED ORDER — ONDANSETRON HCL 4 MG/2ML IJ SOLN
4.0000 mg | Freq: Once | INTRAMUSCULAR | Status: AC
  Administered 2023-02-15: 4 mg via INTRAVENOUS
  Filled 2023-02-15: qty 2

## 2023-02-15 MED ORDER — SULFAMETHOXAZOLE-TRIMETHOPRIM 800-160 MG PO TABS: 1.0000 | ORAL_TABLET | Freq: Once | ORAL | Status: DC

## 2023-02-15 MED ORDER — SULFAMETHOXAZOLE-TRIMETHOPRIM 200-40 MG/5ML PO SUSP
20.0000 mL | Freq: Once | ORAL | Status: AC
  Administered 2023-02-15: 20 mL via ORAL
  Filled 2023-02-15: qty 20

## 2023-02-15 MED ORDER — SODIUM CHLORIDE 0.9 % IV BOLUS
500.0000 mL | Freq: Once | INTRAVENOUS | Status: AC
  Administered 2023-02-15: 500 mL via INTRAVENOUS

## 2023-02-15 MED ORDER — IOHEXOL 300 MG/ML  SOLN
100.0000 mL | Freq: Once | INTRAMUSCULAR | Status: AC | PRN
Start: 2023-02-15 — End: 2023-02-15
  Administered 2023-02-15: 100 mL via INTRAVENOUS

## 2023-02-15 MED ORDER — SULFAMETHOXAZOLE-TRIMETHOPRIM 200-40 MG/5ML PO SUSP
20.0000 mL | Freq: Two times a day (BID) | ORAL | 0 refills | Status: AC
Start: 2023-02-15 — End: 2023-02-22

## 2023-02-15 MED ORDER — ONDANSETRON 4 MG PO TBDP: 4.0000 mg | ORAL_TABLET | Freq: Three times a day (TID) | ORAL | 0 refills | Status: DC | PRN

## 2023-02-15 MED ORDER — FAMOTIDINE IN NACL 20-0.9 MG/50ML-% IV SOLN
20.0000 mg | Freq: Once | INTRAVENOUS | Status: AC
  Administered 2023-02-15: 20 mg via INTRAVENOUS
  Filled 2023-02-15: qty 50

## 2023-02-15 NOTE — Discharge Instructions (Addendum)
You are seen in the emergency department for right sided facial pain and back pain.  You had a CT scan done of your face and of your abdomen and pelvis that showed signs of facial cellulitis.  Your antibiotic was switched to a liquid.  Stop taking your pill form of antibiotic and start taking the liquid form, take your next dose tonight.  Call your primary care physician to schedule close follow-up appointment for reevaluation.  Return to the emergency department for any worsening symptoms.  Thank you for choosing [place] for your health care, it was my pleasure to care for you today!  Corena Herter, MD

## 2023-02-15 NOTE — ED Notes (Signed)
Pt transported to CT ?

## 2023-02-15 NOTE — ED Triage Notes (Signed)
Pt here from her provider with left side flank pain and nausea. Pt states she has had her abx change several times and is not feeling any better. Pt was on ear drops for the cellulitis of her right ear. Pt states the abd pain is worse than her ear pain. Pt states she threw up all day Wed and denies fever.

## 2023-02-15 NOTE — ED Provider Notes (Signed)
[facility] Provider Note    Event Date/Time   First MD Initiated Contact with Patient 02/15/23 1107     (approximate)   History   Nausea and Abdominal Pain   HPI  Danielle Armstrong is a 77 y.o. female presents to the emergency department with right facial pain.  Patient states that she started noting some redness and swelling to his right ear a couple of days ago.  Initially was started on doxycycline and states that she took it as prescribed on an empty stomach.  States that she immediately had multiple episodes of nausea and vomiting.  Called her doctor and was switched to Bactrim, had another episode of vomiting.  States that she has been having difficulty keeping down any antibiotics.  Denies any significant abdominal pain but complaining of some mild lower back pain.  Denies any dysuria, urinary urgency or frequency.  Denies fever.  History of acid reflux.     Physical Exam   Triage Vital Signs: ED Triage Vitals  Encounter Vitals Group     BP 02/15/23 1007 (!) 109/98     Systolic BP Percentile --      Diastolic BP Percentile --      Pulse Rate 02/15/23 1007 98     Resp 02/15/23 1007 18     Temp 02/15/23 1007 98.1 F (36.7 C)     Temp Source 02/15/23 1007 Oral     SpO2 02/15/23 1007 99 %     Weight 02/15/23 1008 181 lb (82.1 kg)     Height 02/15/23 1008 5\' 4"  (1.626 m)     Head Circumference --      Peak Flow --      Pain Score 02/15/23 1007 10     Pain Loc --      Pain Education --      Exclude from Growth Chart --     Most recent vital signs: Vitals:   02/15/23 1500 02/15/23 1533  BP: (!) 135/58   Pulse: 87   Resp:    Temp:  (!) 97.5 F (36.4 C)  SpO2: 100%     Physical Exam Constitutional:      Appearance: She is well-developed.  HENT:     Head: Atraumatic.     Comments: Erythema and warmth to the right ear and right side of the face extending to the right neck.  Normal TMs bilaterally.  No tenderness to the mastoid. Eyes:      Extraocular Movements: Extraocular movements intact.     Conjunctiva/sclera: Conjunctivae normal.     Pupils: Pupils are equal, round, and reactive to light.  Cardiovascular:     Rate and Rhythm: Regular rhythm.  Pulmonary:     Effort: No respiratory distress.  Abdominal:     General: There is no distension.     Tenderness: There is generalized abdominal tenderness.  Musculoskeletal:        General: Normal range of motion.     Cervical back: Normal range of motion.  Skin:    General: Skin is warm.  Neurological:     Mental Status: She is alert. Mental status is at baseline.     IMPRESSION / MDM / ASSESSMENT AND PLAN / ED COURSE  I reviewed the triage vital signs and the nursing notes.  Differential diagnosis including facial cellulitis, abscess, intra-abdominal process, GERD, pill esophagitis  EKG  I, Corena Herter, the attending physician, personally viewed and interpreted this ECG.   Rate: 103  Rhythm: Normal sinus  Axis: Normal  Intervals: Normal  ST&T Change: None  No tachycardic or bradycardic dysrhythmias while on cardiac telemetry.  RADIOLOGY I independently reviewed imaging, my interpretation of imaging: CT scan of the face without signs of an abscess  CT scan of abdomen and pelvis without acute intra-abdominal pathology  LABS (all labs ordered are listed, but only abnormal results are displayed) Labs interpreted as -    Labs Reviewed  COMPREHENSIVE METABOLIC PANEL - Abnormal; Notable for the following components:      Result Value   Glucose, Bld 132 (*)    All other components within normal limits  CBC - Abnormal; Notable for the following components:   Hemoglobin 15.7 (*)    HCT 46.6 (*)    All other components within normal limits  URINALYSIS, ROUTINE W REFLEX MICROSCOPIC - Abnormal; Notable for the following components:   Color, Urine YELLOW (*)    APPearance CLEAR (*)    Ketones, ur 5 (*)    All other components within normal limits  LIPASE,  BLOOD     MDM  Lab work overall reassuring.  no significant leukocytosis.  Creatinine at baseline.  Clinic picture is consistent with a cellulitis.  Most likely had pill esophagitis secondary to taking doxycycline on empty stomach.  Patient was given IV Zofran, IV Pepcid and liquid form of Bactrim.  Able to tolerate p.o. without further difficulties.  Will switch from pill Bactrim to suspension form.  Given a prescription for ondansetron.  Patient already takes PPI twice a day.  Discussed close follow-up with her primary care physician and return to the emergency department if she had recurrent episodes of vomiting and was unable to keep down her antibiotics.     PROCEDURES:  Critical Care performed: No  Procedures  Patient's presentation is most consistent with acute presentation with potential threat to life or bodily function.   MEDICATIONS ORDERED IN ED: Medications  sodium chloride 0.9 % bolus 500 mL (0 mLs Intravenous Stopped 02/15/23 1313)  ondansetron (ZOFRAN) injection 4 mg (4 mg Intravenous Given 02/15/23 1233)  iohexol (OMNIPAQUE) 300 MG/ML solution 100 mL (100 mLs Intravenous Contrast Given 02/15/23 1309)  famotidine (PEPCID) IVPB 20 mg premix (20 mg Intravenous New Bag/Given 02/15/23 1531)  sulfamethoxazole-trimethoprim (BACTRIM) 200-40 MG/5ML suspension 20 mL (20 mLs Oral Given 02/15/23 1542)    FINAL CLINICAL IMPRESSION(S) / ED DIAGNOSES   Final diagnoses:  Facial cellulitis  Acute gastritis without hemorrhage, unspecified gastritis type     Rx / DC Orders   ED Discharge Orders          Ordered    sulfamethoxazole-trimethoprim (BACTRIM) 200-40 MG/5ML suspension  2 times daily        02/15/23 1555    ondansetron (ZOFRAN-ODT) 4 MG disintegrating tablet  Every 8 hours PRN        02/15/23 1555             Note:  This document was prepared using Dragon voice recognition software and may include unintentional dictation errors.   Corena Herter,  MD 02/15/23 [phone]

## 2023-02-18 ENCOUNTER — Inpatient Hospital Stay
Admission: RE | Admit: 2023-02-18 | Discharge: 2023-02-18 | Disposition: A | Discharge status: Home / Self Care | Payer: Medicare HMO | Source: Ambulatory Visit

## 2023-02-19 DIAGNOSIS — R197 Diarrhea, unspecified: Secondary | ICD-10-CM | POA: Diagnosis not present

## 2023-02-19 DIAGNOSIS — L03211 Cellulitis of face: Secondary | ICD-10-CM | POA: Diagnosis not present

## 2023-02-19 DIAGNOSIS — H6011 Cellulitis of right external ear: Secondary | ICD-10-CM | POA: Diagnosis not present

## 2023-02-19 DIAGNOSIS — R112 Nausea with vomiting, unspecified: Secondary | ICD-10-CM | POA: Diagnosis not present

## 2023-02-20 ENCOUNTER — Encounter: Admission: RE | Payer: Self-pay | Source: Home / Self Care

## 2023-02-20 ENCOUNTER — Ambulatory Visit: Admission: RE | Admit: 2023-02-20 | Payer: Medicare HMO | Source: Home / Self Care | Admitting: Surgery

## 2023-02-20 SURGERY — RELEASE TRIGGER FINGER/A-1 PULLEY
Anesthesia: Choice | Site: Finger | Laterality: Right

## 2023-03-14 ENCOUNTER — Other Ambulatory Visit: Payer: Self-pay | Admitting: Surgery

## 2023-03-18 DIAGNOSIS — M65341 Trigger finger, right ring finger: Secondary | ICD-10-CM | POA: Diagnosis not present

## 2023-03-19 ENCOUNTER — Encounter
Admission: RE | Admit: 2023-03-19 | Discharge: 2023-03-19 | Disposition: A | Discharge status: Home / Self Care | Payer: Medicare HMO | Source: Ambulatory Visit | Attending: Surgery | Admitting: Surgery

## 2023-03-19 ENCOUNTER — Other Ambulatory Visit: Payer: Self-pay

## 2023-03-19 NOTE — Patient Instructions (Signed)
Your procedure is scheduled on: Wednesday 03/27/23 To find out your arrival time, please call [phone] between 1PM - 3PM on:  Tuesday 03/26/23  Report to the Registration Desk on the 1st floor of the [facility]. FREE Valet parking is available at the Medical mall  If your arrival time is 6:00 am, do not arrive before that time as the Medical Mall entrance doors do not open until 6:00 am.  REMEMBER: Instructions that are not followed completely may result in serious medical risk, up to and including death; or upon the discretion of your surgeon and anesthesiologist your surgery may need to be rescheduled.  Do not eat food after midnight the night before surgery.  No gum chewing or hard candies.  You may however, drink CLEAR liquids up to 2 hours before you are scheduled to arrive for your surgery. Do not drink anything within 2 hours of your scheduled arrival time.  Clear liquids include: - water  - apple juice without pulp - gatorade (not RED colors) - black coffee or tea (Do NOT add milk or creamers to the coffee or tea) Do NOT drink anything that is not on this list.  Type 1 and Type 2 diabetics should only drink water.  In addition, your doctor has ordered for you to drink the provided:  Ensure Pre-Surgery Clear Carbohydrate Drink  Drinking this carbohydrate drink up to two hours before surgery helps to reduce insulin resistance and improve patient outcomes. Please complete drinking 2 hours before scheduled arrival time.  One week prior to surgery: Stop Anti-inflammatories (NSAIDS) such as Advil, Aleve, Ibuprofen, Motrin, Naproxen, Naprosyn and Aspirin based products such as Excedrin, Goody's Powder, BC Powder. You may however, continue to take Tylenol if needed for pain up until the day of surgery.  Stop ANY OVER THE COUNTER supplements until after surgery.  Continue taking all prescribed medications. :  TAKE ONLY THESE MEDICATIONS THE MORNING OF SURGERY WITH A SIP OF  WATER:  cetirizine (ZYRTEC) 10 MG tablet  metoprolol tartrate (LOPRESSOR) 25 MG tablet  pantoprazole (PROTONIX) 40 MG tablet   No Alcohol for 24 hours before or after surgery.  No Smoking including e-cigarettes for 24 hours before surgery.  No chewable tobacco products for at least 6 hours before surgery.  No nicotine patches on the day of surgery.  Do not use any "recreational" drugs for at least a week (preferably 2 weeks) before your surgery.  Please be advised that the combination of cocaine and anesthesia may have negative outcomes, up to and including death. If you test positive for cocaine, your surgery will be cancelled.  On the morning of surgery brush your teeth with toothpaste and water, you may rinse your mouth with mouthwash if you wish. Do not swallow any toothpaste or mouthwash.  Use CHG Soap or wipes as directed on instruction sheet.  Do not wear lotions, powders, or perfumes.   Do not shave body hair from the neck down 48 hours before surgery.  Wear clean comfortable clothing (specific to your surgery type) to the hospital.  Do not wear jewelry, make-up, hairpins, clips or nail polish.  For welded (permanent) jewelry: bracelets, anklets, waist bands, etc.  Please have this removed prior to surgery.  If it is not removed, there is a chance that hospital personnel will need to cut it off on the day of surgery. Contact lenses, hearing aids and dentures may not be worn into surgery.  Do not bring valuables to the hospital. [facility]  is not responsible for any missing/lost belongings or valuables.   Notify your doctor if there is any change in your medical condition (cold, fever, infection).  If you are being discharged the day of surgery, you will not be allowed to drive home. You will need a responsible individual to drive you home and stay with you for 24 hours after surgery.   If you are taking public transportation, you will need to have a responsible  individual with you.  If you are being admitted to the hospital overnight, leave your suitcase in the car. After surgery it may be brought to your room.  In case of increased patient census, it may be necessary for you, the patient, to continue your postoperative care in the Same Day Surgery department.  After surgery, you can help prevent lung complications by doing breathing exercises.  Take deep breaths and cough every 1-2 hours. Your doctor may order a device called an Incentive Spirometer to help you take deep breaths. When coughing or sneezing, hold a pillow firmly against your incision with both hands. This is called "splinting." Doing this helps protect your incision. It also decreases belly discomfort.  Surgery Visitation Policy:  Patients undergoing a surgery or procedure may have two family members or support persons with them as long as the person is not COVID-19 positive or experiencing its symptoms.   Inpatient Visitation:    Visiting hours are 7 a.m. to 8 p.m. Up to four visitors are allowed at one time in a patient room. The visitors may rotate out with other people during the day. One designated support person (adult) may remain overnight.  Please call the Pre-admissions Testing Dept. at [phone] if you have any questions about these instructions.     Preparing for Surgery with CHLORHEXIDINE GLUCONATE (CHG) Soap  Chlorhexidine Gluconate (CHG) Soap  o An antiseptic cleaner that kills germs and bonds with the skin to continue killing germs even after washing  o Used for showering the night before surgery and morning of surgery  Before surgery, you can play an important role by reducing the number of germs on your skin.  CHG (Chlorhexidine gluconate) soap is an antiseptic cleanser which kills germs and bonds with the skin to continue killing germs even after washing.  Please do not use if you have an allergy to CHG or antibacterial soaps. If your skin becomes  reddened/irritated stop using the CHG.  1. Shower the NIGHT BEFORE SURGERY and the MORNING OF SURGERY with CHG soap.  2. If you choose to wash your hair, wash your hair first as usual with your normal shampoo.  3. After shampooing, rinse your hair and body thoroughly to remove the shampoo.  4. Use CHG as you would any other liquid soap. You can apply CHG directly to the skin and wash gently with a scrungie or a clean washcloth.  5. Apply the CHG soap to your body only from the neck down. Do not use on open wounds or open sores. Avoid contact with your eyes, ears, mouth, and genitals (private parts). Wash face and genitals (private parts) with your normal soap.  6. Wash thoroughly, paying special attention to the area where your surgery will be performed.  7. Thoroughly rinse your body with warm water.  8. Do not shower/wash with your normal soap after using and rinsing off the CHG soap.  9. Pat yourself dry with a clean towel.  10. Wear clean pajamas to bed the night before surgery.  12. Place clean sheets on your bed the night of your first shower and do not sleep with pets.  13. Shower again with the CHG soap on the day of surgery prior to arriving at the hospital.  14. Do not apply any deodorants/lotions/powders.  15. Please wear clean clothes to the hospital.

## 2023-03-26 MED ORDER — LACTATED RINGERS IV SOLN: INTRAVENOUS | Status: DC

## 2023-03-26 MED ORDER — ORAL CARE MOUTH RINSE: 15.0000 mL | Freq: Once | OROMUCOSAL | Status: AC

## 2023-03-26 MED ORDER — CEFAZOLIN SODIUM-DEXTROSE 2-4 GM/100ML-% IV SOLN
2.0000 g | INTRAVENOUS | Status: AC
Start: 2023-03-27 — End: 2023-03-28
  Administered 2023-03-27: 2 g via INTRAVENOUS

## 2023-03-26 MED ORDER — CHLORHEXIDINE GLUCONATE 0.12 % MT SOLN
15.0000 mL | Freq: Once | OROMUCOSAL | Status: AC
  Administered 2023-03-27: 15 mL via OROMUCOSAL

## 2023-03-27 ENCOUNTER — Ambulatory Visit: Payer: Medicare HMO | Admitting: Certified Registered"

## 2023-03-27 ENCOUNTER — Other Ambulatory Visit: Payer: Self-pay

## 2023-03-27 ENCOUNTER — Encounter: Payer: Self-pay | Admitting: Surgery

## 2023-03-27 ENCOUNTER — Ambulatory Visit
Admission: RE | Admit: 2023-03-27 | Discharge: 2023-03-27 | Disposition: A | Discharge status: Home / Self Care | Payer: Medicare HMO | Attending: Surgery | Admitting: Surgery

## 2023-03-27 ENCOUNTER — Ambulatory Visit: Payer: Medicare HMO | Admitting: Urgent Care

## 2023-03-27 ENCOUNTER — Encounter
Admission: RE | Disposition: A | Discharge status: Home / Self Care | Payer: Self-pay | Source: Home / Self Care | Attending: Surgery

## 2023-03-27 DIAGNOSIS — K219 Gastro-esophageal reflux disease without esophagitis: Secondary | ICD-10-CM | POA: Insufficient documentation

## 2023-03-27 DIAGNOSIS — Z87891 Personal history of nicotine dependence: Secondary | ICD-10-CM | POA: Insufficient documentation

## 2023-03-27 DIAGNOSIS — R569 Unspecified convulsions: Secondary | ICD-10-CM | POA: Insufficient documentation

## 2023-03-27 DIAGNOSIS — I10 Essential (primary) hypertension: Secondary | ICD-10-CM | POA: Insufficient documentation

## 2023-03-27 DIAGNOSIS — M65351 Trigger finger, right little finger: Secondary | ICD-10-CM | POA: Diagnosis not present

## 2023-03-27 DIAGNOSIS — M65341 Trigger finger, right ring finger: Secondary | ICD-10-CM | POA: Diagnosis not present

## 2023-03-27 HISTORY — PX: TRIGGER FINGER RELEASE: SHX641

## 2023-03-27 SURGERY — RELEASE, A1 PULLEY, FOR TRIGGER FINGER
Anesthesia: General | Site: Finger | Laterality: Right

## 2023-03-27 MED ORDER — OXYCODONE HCL 5 MG PO TABS: 5.0000 mg | ORAL_TABLET | Freq: Once | ORAL | Status: DC | PRN

## 2023-03-27 MED ORDER — PROPOFOL 10 MG/ML IV BOLUS
INTRAVENOUS | Status: DC | PRN
  Administered 2023-03-27: 30 mg via INTRAVENOUS
  Administered 2023-03-27: 150 mg via INTRAVENOUS

## 2023-03-27 MED ORDER — ONDANSETRON HCL 4 MG/2ML IJ SOLN
INTRAMUSCULAR | Status: DC | PRN
  Administered 2023-03-27: 4 mg via INTRAVENOUS

## 2023-03-27 MED ORDER — SUCCINYLCHOLINE CHLORIDE 200 MG/10ML IV SOSY
PREFILLED_SYRINGE | INTRAVENOUS | Status: DC | PRN
  Administered 2023-03-27: 100 mg via INTRAVENOUS

## 2023-03-27 MED ORDER — OXYCODONE HCL 5 MG/5ML PO SOLN: 5.0000 mg | Freq: Once | ORAL | Status: DC | PRN

## 2023-03-27 MED ORDER — FENTANYL CITRATE (PF) 100 MCG/2ML IJ SOLN
INTRAMUSCULAR | Status: DC | PRN
  Administered 2023-03-27 (×2): 50 ug via INTRAVENOUS

## 2023-03-27 MED ORDER — CEFAZOLIN SODIUM-DEXTROSE 2-4 GM/100ML-% IV SOLN
INTRAVENOUS | Status: AC
Start: 2023-03-27 — End: ?
  Filled 2023-03-27: qty 100

## 2023-03-27 MED ORDER — METOCLOPRAMIDE HCL 5 MG/ML IJ SOLN: 5.0000 mg | Freq: Three times a day (TID) | INTRAMUSCULAR | Status: DC | PRN

## 2023-03-27 MED ORDER — PROPOFOL 1000 MG/100ML IV EMUL
INTRAVENOUS | Status: AC
  Filled 2023-03-27: qty 100

## 2023-03-27 MED ORDER — METOCLOPRAMIDE HCL 10 MG PO TABS: 5.0000 mg | ORAL_TABLET | Freq: Three times a day (TID) | ORAL | Status: DC | PRN

## 2023-03-27 MED ORDER — ONDANSETRON HCL 4 MG/2ML IJ SOLN: 4.0000 mg | Freq: Four times a day (QID) | INTRAMUSCULAR | Status: DC | PRN

## 2023-03-27 MED ORDER — ONDANSETRON HCL 4 MG PO TABS: 4.0000 mg | ORAL_TABLET | Freq: Four times a day (QID) | ORAL | Status: DC | PRN

## 2023-03-27 MED ORDER — PROPOFOL 10 MG/ML IV BOLUS
INTRAVENOUS | Status: AC
Start: 2023-03-27 — End: ?
  Filled 2023-03-27: qty 20

## 2023-03-27 MED ORDER — PROPOFOL 500 MG/50ML IV EMUL
INTRAVENOUS | Status: DC | PRN
Start: 2023-03-27 — End: 2023-03-27
  Administered 2023-03-27: 150 ug/kg/min via INTRAVENOUS

## 2023-03-27 MED ORDER — 0.9 % SODIUM CHLORIDE (POUR BTL) OPTIME
TOPICAL | Status: DC | PRN
  Administered 2023-03-27: 500 mL

## 2023-03-27 MED ORDER — CHLORHEXIDINE GLUCONATE 0.12 % MT SOLN
OROMUCOSAL | Status: AC
  Filled 2023-03-27: qty 15

## 2023-03-27 MED ORDER — FENTANYL CITRATE (PF) 100 MCG/2ML IJ SOLN: 25.0000 ug | INTRAMUSCULAR | Status: DC | PRN

## 2023-03-27 MED ORDER — DEXAMETHASONE SODIUM PHOSPHATE 10 MG/ML IJ SOLN
INTRAMUSCULAR | Status: DC | PRN
  Administered 2023-03-27: 5 mg via INTRAVENOUS

## 2023-03-27 MED ORDER — LIDOCAINE HCL (CARDIAC) PF 100 MG/5ML IV SOSY
PREFILLED_SYRINGE | INTRAVENOUS | Status: DC | PRN
  Administered 2023-03-27: 60 mg via INTRAVENOUS

## 2023-03-27 MED ORDER — LIDOCAINE HCL (PF) 1 % IJ SOLN
INTRAMUSCULAR | Status: AC
Start: 2023-03-27 — End: ?
  Filled 2023-03-27: qty 30

## 2023-03-27 MED ORDER — BUPIVACAINE HCL (PF) 0.5 % IJ SOLN
INTRAMUSCULAR | Status: AC
  Filled 2023-03-27: qty 30

## 2023-03-27 MED ORDER — BUPIVACAINE HCL (PF) 0.5 % IJ SOLN
INTRAMUSCULAR | Status: DC | PRN
  Administered 2023-03-27: 10 mL

## 2023-03-27 MED ORDER — FENTANYL CITRATE (PF) 100 MCG/2ML IJ SOLN
INTRAMUSCULAR | Status: AC
  Filled 2023-03-27: qty 2

## 2023-03-27 MED ORDER — ACETAMINOPHEN 325 MG PO TABS: 325.0000 mg | ORAL_TABLET | Freq: Four times a day (QID) | ORAL | Status: DC | PRN

## 2023-03-27 MED ORDER — DEXTROSE-SODIUM CHLORIDE 5-0.9 % IV SOLN: INTRAVENOUS | Status: DC

## 2023-03-27 SURGICAL SUPPLY — 29 items
BNDG COHESIVE 4X5 TAN STRL LF (GAUZE/BANDAGES/DRESSINGS) ×1 IMPLANT
BNDG ESMARCH 4X12 STRL LF (GAUZE/BANDAGES/DRESSINGS) ×1 IMPLANT
BNDG STRETCH GAUZE 3IN X12FT (GAUZE/BANDAGES/DRESSINGS) ×1 IMPLANT
CHLORAPREP W/TINT 26 (MISCELLANEOUS) ×1 IMPLANT
CORD BIP STRL DISP 12FT (MISCELLANEOUS) IMPLANT
CUFF TOURN SGL QUICK 18X4 (TOURNIQUET CUFF) IMPLANT
DRAPE FLUOR MINI C-ARM 54X84 (DRAPES) ×1 IMPLANT
FORCEPS JEWEL BIP 4-3/4 STR (INSTRUMENTS) IMPLANT
GAUZE SPONGE 4X4 12PLY STRL (GAUZE/BANDAGES/DRESSINGS) ×1 IMPLANT
GAUZE XEROFORM 1X8 LF (GAUZE/BANDAGES/DRESSINGS) ×1 IMPLANT
GLOVE BIO SURGEON STRL SZ8 (GLOVE) ×2 IMPLANT
GLOVE BIOGEL PI IND STRL 8 (GLOVE) ×1 IMPLANT
GLOVE INDICATOR 8.0 STRL GRN (GLOVE) ×1 IMPLANT
GOWN STRL REUS W/ TWL LRG LVL3 (GOWN DISPOSABLE) ×1 IMPLANT
GOWN STRL REUS W/ TWL XL LVL3 (GOWN DISPOSABLE) ×1 IMPLANT
KIT TURNOVER KIT A (KITS) ×1 IMPLANT
MANIFOLD NEPTUNE II (INSTRUMENTS) ×1 IMPLANT
NDL HYPO 25X1 1.5 SAFETY (NEEDLE) ×1 IMPLANT
NEEDLE HYPO 25X1 1.5 SAFETY (NEEDLE) ×1 IMPLANT
NS IRRIG 500ML POUR BTL (IV SOLUTION) ×1 IMPLANT
PACK EXTREMITY ARMC (MISCELLANEOUS) ×1 IMPLANT
SPLINT WRIST LG RT TX900304 (SOFTGOODS) IMPLANT
SPLINT WRIST M LT TX990308 (SOFTGOODS) ×1 IMPLANT
STOCKINETTE IMPERV 14X48 (MISCELLANEOUS) ×1 IMPLANT
STOCKINETTE IMPERVIOUS 9X36 MD (GAUZE/BANDAGES/DRESSINGS) ×1 IMPLANT
SUT PROLENE 4 0 PS 2 18 (SUTURE) IMPLANT
SYR 10ML LL (SYRINGE) ×1 IMPLANT
TRAP FLUID SMOKE EVACUATOR (MISCELLANEOUS) ×1 IMPLANT
WATER STERILE IRR 500ML POUR (IV SOLUTION) ×1 IMPLANT

## 2023-03-27 NOTE — Transfer of Care (Signed)
Immediate Anesthesia Transfer of Care Note  Patient: Danielle Armstrong  Procedure(s) Performed: RELEASE OF RIGHT RING AND LITTLE TRIGGER FINGERS (Right: Finger)  Patient Location: PACU  Anesthesia Type:General  Level of Consciousness: drowsy and patient cooperative  Airway & Oxygen Therapy: Patient Spontanous Breathing and Patient connected to face mask oxygen  Post-op Assessment: Report given to RN, Post -op Vital signs reviewed and stable, and Patient moving all extremities X 4  Post vital signs: Reviewed and stable  Last Vitals:  Vitals Value Taken Time  BP 182/85 03/27/23 1327  Temp    Pulse 69 03/27/23 1329  Resp 19 03/27/23 1329  SpO2 97 % 03/27/23 1329  Vitals shown include unfiled device data.  Last Pain:  Vitals:   03/27/23 1131  PainSc: 0-No pain         Complications: No notable events documented.

## 2023-03-27 NOTE — Discharge Instructions (Addendum)
 Orthopedic discharge instructions: Keep dressing dry and intact. Keep hand elevated above heart level. May shower after dressing removed on postop day 4 (Sunday). Cover sutures with Band-Aids after drying off. Apply ice to affected area frequently. Take ibuprofen 600 mg TID with meals for 3-5 days, then as necessary. Take ES Tylenol or pain medication as prescribed when needed.  Return for follow-up in 10-14 days or as scheduled.

## 2023-03-27 NOTE — Anesthesia Postprocedure Evaluation (Signed)
Anesthesia Post Note  Patient: Danielle Armstrong  Procedure(s) Performed: RELEASE OF RIGHT RING AND LITTLE TRIGGER FINGERS (Right: Finger)  Patient location during evaluation: PACU Anesthesia Type: General Level of consciousness: awake and alert Pain management: pain level controlled Vital Signs Assessment: post-procedure vital signs reviewed and stable Respiratory status: spontaneous breathing, nonlabored ventilation, respiratory function stable and patient connected to nasal cannula oxygen Cardiovascular status: blood pressure returned to baseline and stable Postop Assessment: no apparent nausea or vomiting Anesthetic complications: no  No notable events documented.   Last Vitals:  Vitals:   03/27/23 1400 03/27/23 1415  BP: (!) 156/63 (!) 147/64  Pulse: 72 67  Resp: 12 10  Temp:  (!) 36.2 C  SpO2: 99% 97%    Last Pain:  Vitals:   03/27/23 1415  PainSc: 0-No pain                 Stephanie Coup

## 2023-03-27 NOTE — H&P (Signed)
History of Present Illness: Danielle Armstrong is a 77 y.o. who presents today for a physical. She is to undergo release of triggering of the right ring and little finger on 03/27/2023. Since her last visit at the clinic there is been no improvement in her condition. The patient expresses her desire to proceed with surgery.  The patient notes that the symptoms have been present for 4-6 months but have been worsening over the past several months. She has difficulty writing or performing repetitive activities due to the painful catching of these digits. She has undergone right and left long trigger finger releases in the past from which she has done quite well. She denies any specific injury to either finger, and denies any numbness or paresthesias to her fingertips. Patient has also had release of fingers on the left hand.  Past Medical History: Allergic state  Arthritis of right glenohumeral joint  Barrett esophagus  Breast cyst  Cancer of cervix (CMS/HHS-HCC)  Chicken pox  Depression  Epilepsy (CMS/HHS-HCC)  GERD (gastroesophageal reflux disease)  Glaucoma (increased eye pressure)  H/O colonoscopy 05/2013  Hemorrhoids  Hyperlipidemia  Hypertension  Migraines  Neuropathy  a couple of years - getting worse  Osteoarthritis  Osteoporosis, post-menopausal  Pleurisy  Seizures (CMS/HHS-HCC)  Umbilical hernia   Past Surgical History: zostavax 03/2011  pneumovax 03/2013  Tdap 03/2013  COLONOSCOPY 05/26/2013  Arthroscopic partial medial meniscectomy plus debridement and coblation of medial femoral chondral lesion Left 08/13/2014  Dr.Kernodle  EXTENSIVE ARTHROSCOPIC DEBRIDEMENT, ARTHROSCOPIC SUBACROMIAL DECOMPRESSION, AND MINI OPEN BICEPS TENODESIS , RIGHT SHOULDER Right 06/26/2018  Garren Greenman  Release right long trigger finger. Right 03/02/2020 (Dr. Joice Lofts)  1. Extensive arthroscopic debridement, arthroscopic subacromial decompression, and biceps tenolysis, left shoulder Left 08/17/2021  Dr.Phinneas Shakoor   2. Release left index trigger finger. 3. Release left long trigger finger Left 08/17/2021  Dr.Marjoria Mancillas  CARPAL TUNNEL RELEASE  Cyst removed from breast  Cyst Removed from under Right wrist  EXCISION PILONIDAL CYST/SINUS  Hemorrhoid surgery  HYSTERECTOMY VAGINAL  OOPHORECTOMY  complete hyst. in 1992  oral teeth extraction  Right carpal tunnel  Right Hand Tendon Removed  TONSILLECTOMY   Past Family History: Breast cancer Mother  Arthritis Sister  Rheum arthritis Son  Epilepsy Other  father's side of family  Osteoporosis (Thinning of bones) Other  mother's side of family  Stroke Other   Medications: alpha lipoic acid 200 mg Tab Take 600 mg by mouth once daily.  aspirin (VAZALORE) 81 mg Cap Take 1 caplet by mouth once daily  cetirizine (ZYRTEC) 10 MG tablet Take 10 mg by mouth every morning  CHLORPHENIRAMINE MALEATE (CHLORTABS ORAL) Take 1 tablet by mouth as needed  DICLOFENAC SODIUM TOPICAL Apply topically once as needed  GINKGO BILOBA EXTRACT ORAL Take 120 mg by mouth once daily  Herbal Supplement Herbal Name: juice plus  Herbal Supplement Take by mouth (NEURIVA) CHEW. 2 chews daily  inulin/sorbitol (FIBER CHOICE ORAL) Take by mouth 1 daily  metoprolol tartrate (LOPRESSOR) 25 MG tablet TAKE 1 TABLET TWICE DAILY 180 tablet 3  ondansetron (ZOFRAN-ODT) 4 MG disintegrating tablet Take 1 tablet (4 mg total) by mouth every 8 (eight) hours as needed for Nausea May take TWO at a time IF NEEDED. Take 30 min - 1 hour before taking doxycycline. 20 tablet 0  pantoprazole (PROTONIX) 40 MG DR tablet TAKE 1 TABLET TWICE DAILY BEFORE MEALS 180 tablet 3  REDNESS RELIEVER EYE DROPS 0.05 % ophthalmic solution Place 1 drop into both eyes as needed  tacrolimus (PROTOPIC)  0.1 % ointment Apply 1 Application topically once daily as needed  triamcinolone 0.1 % cream Apply 1 Application topically once daily as needed   Allergies: Doxycycline (Nausea And Vomiting)   Review of Systems: A comprehensive  14 point ROS was performed, reviewed, and the pertinent orthopaedic findings are documented in the HPI.  Physical Exam: BP 132/80 (BP Location: Left upper arm, Patient Position: Sitting, BP Cuff Size: Large Adult)  Ht 162.6 cm (5\' 4" )  Wt 82.4 kg (181 lb 9.6 oz)  BMI 31.17 kg/m   General: Well-developed well-nourished female seen in no acute distress.   HEENT: Atraumatic,normocephalic. Pupils are equal and reactive to light. Oropharynx is clear with moist mucosa  Lungs: Clear to auscultation bilaterally   Cardiovascular: Regular rate and rhythm. Normal S1, S2. No murmurs. No appreciable gallops or rubs. Peripheral pulses are palpable.  Abdomen: Soft, non-tender, nondistended. Bowel sounds present  Right hand exam: Skin inspection of the right hand is notable for a well-healed surgical incision, but otherwise is unremarkable. No swelling, erythema, ecchymosis, abrasions, or other skin abnormalities are identified. She has mild tenderness to palpation over the palmar aspects of the ring and little metacarpal heads. Otherwise, there is no tenderness to palpation over the dorsal or palmar aspects of her hand. She exhibits full active and passive range of motion of all digits, but the ring and little fingers have a reproducible painful catching, consistent with trigger fingers. She is grossly neurovascularly intact to all digits.  Neurological: The patient is alert and oriented Sensation to light touch appears to be intact and within normal limits Gross motor strength appeared to be equal to 5/5  Vascular: Peripheral pulses felt to be palpable. Capillary refill appears to be intact and within normal limits  Impression: 1. Triggering of right ring and little fingers.  Plan:  The treatment options were discussed with the patient. In addition, patient educational materials were provided regarding the diagnosis and treatment options. Regarding her right ring and little fingers, the  patient is quite frustrated by her painful catching and would like to consider more aggressive treatment options. Therefore, I have recommended a surgical procedure, specifically a release of the right ring and little trigger fingers. The procedure was discussed with the patient, as were the potential risks (including bleeding, infection, nerve and/or blood vessel injury, persistent or recurrent pain/catching, weakness of grip, stiffness of the fingers, need for further surgery, blood clots, strokes, heart attacks and/or arhythmias, pneumonia, etc.) and benefits. The patient states her understanding and wishes to proceed. All of the patient's questions and concerns were answered. She can call any time with further concerns. She will follow up post-surgery, routine.     H&P reviewed and patient re-examined. No changes.

## 2023-03-27 NOTE — Anesthesia Procedure Notes (Signed)
Procedure Name: Intubation Date/Time: 03/27/2023 12:34 PM  Performed by: Lanell Matar, CRNAPre-anesthesia Checklist: Patient identified, Emergency Drugs available, Suction available and Patient being monitored Patient Re-evaluated:Patient Re-evaluated prior to induction Oxygen Delivery Method: Circle System Utilized Preoxygenation: Pre-oxygenation with 100% oxygen Induction Type: IV induction and Rapid sequence Ventilation: Mask ventilation without difficulty Laryngoscope Size: McGrath and 4 Grade View: Grade I Tube type: Oral Tube size: 7.0 mm Number of attempts: 1 Airway Equipment and Method: Stylet and Oral airway Placement Confirmation: ETT inserted through vocal cords under direct vision, positive ETCO2 and breath sounds checked- equal and bilateral Secured at: 20 cm Tube secured with: Tape Dental Injury: Teeth and Oropharynx as per pre-operative assessment

## 2023-03-27 NOTE — Op Note (Addendum)
03/27/2023  1:29 PM  Patient:   Danielle Armstrong  Pre-Op Diagnosis:   Right ring and little trigger fingers.  Post-Op Diagnosis:   Same  Procedure:   Release of right ring and little trigger fingers.  Surgeon:   Maryagnes Amos, MD  Assistant:   None  Anesthesia:   GET  Findings:   As above.  Complications:   None  EBL:   0 cc  Fluids:   600 cc crystalloid  TT:   23 minutes at 250 mmHg  Drains:   None  Closure:   4-0 Prolene  Implants:   None  Brief Clinical Note:   The patient is a 77 year old female with a history of progressively worsening painful catching of her right ring and little fingers. These symptoms have progressed despite medications, activity modification, etc. The patient's history and examination were consistent with right ring and small trigger fingers. The patient presents at this time for release of the right ring and small trigger fingers.  Procedure:   The patient was brought into the operating room and lain in the supine position. After adequate general endotracheal intubation and anesthesia were obtained, the right hand and upper extremity were prepped with ChloraPrep solution before being draped sterilely. Preoperative antibiotics were administered. A timeout was performed to verify the appropriate surgical site before the limb was exsanguinated with an Esmarch and the tourniquet inflated to 250 mmHg.  An approximately 2-2.5 cm incision was made over the volar aspect of the right ring and little fingers at the level of the metacarpal heads centered over the flexor sheath. The right ring trigger finger was approached first. This incision was carried down through the subcutaneous tissues with care taken to identify and protect the digital neurovascular structures. The flexor sheath was entered just proximal to the A1 pulley. The sheath was released proximally for several centimeters under direct visualization. Distally, a clamp was placed beneath the A1  pulley and used to release any adhesions. The clamp was repositioned so that one jaw was superficial to and the other jaw deep to the A1 pulley. The A1 pulley was incised on either side of the clamp to remove a 2 mm strip of tissue. Metzenbaum scissors were used to ensure complete release of the A1 pulley more distally. The underlying tendons were carefully inspected and found to be intact.   Next, the right little finger was addressed. The incision was carried down through the subcutaneous tissues with care taken to identify and protect the digital neurovascular structures. The flexor sheath was entered just proximal to the A1 pulley. The sheath was released proximally for several centimeters under direct visualization. Distally, a clamp was placed beneath the A1 pulley and used to release any adhesions. The clamp was repositioned so that one jaw was superficial to and the other jaw deep to the A1 pulley. The A1 pulley was incised on either side of the clamp to remove a 2 mm strip of tissue. Metzenbaum scissors were used to ensure complete release of the A1 pulley more distally. The underlying tendons were carefully inspected and found to be intact.  The wound was copiously irrigated with sterile saline solution before the wound was closed using 4-0 Prolene interrupted sutures. A total of 10 cc of 0.5% plain Sensorcaine was injected in and around the incision to help with postoperative analgesia before a sterile bulky dressing was applied to the hand. The patient was then awakened and returned to the recovery room in satisfactory condition  after tolerating the procedure well.

## 2023-03-27 NOTE — Anesthesia Preprocedure Evaluation (Signed)
Anesthesia Evaluation  Patient identified by MRN, date of birth, ID band Patient awake    Reviewed: Allergy & Precautions, NPO status , Patient's Chart, lab work & pertinent test results  History of Anesthesia Complications (+) PONV and history of anesthetic complications  Airway Mallampati: II  TM Distance: >3 FB Neck ROM: full    Dental  (+) Missing   Pulmonary neg shortness of breath, former smoker   Pulmonary exam normal        Cardiovascular Exercise Tolerance: Good hypertension, (-) angina (-) DOE Normal cardiovascular exam     Neuro/Psych  Headaches, Seizures -, Well Controlled,   negative psych ROS   GI/Hepatic Neg liver ROS,GERD  Controlled,,  Endo/Other  negative endocrine ROS    Renal/GU      Musculoskeletal  (+) Arthritis ,    Abdominal   Peds  Hematology negative hematology ROS (+)   Anesthesia Other Findings Patient stated this morning that she has been very nauseous this morning. She stated she tried to throw up multiple times this morning but her stomach has "settled down for the most part". Due to the elevated risk of aspiration, will proceed with an ETT. Patient stated she understood and agreed.   Past Medical History: No date: Barretts esophagus 1992: Cancer (HCC)     Comment:  cervical No date: Epilepsy (HCC)     Comment:  nonoe in years No date: GERD (gastroesophageal reflux disease) No date: Glaucoma No date: HLD (hyperlipidemia) No date: HTN (hypertension) No date: Irregular heart rate     Comment:  per patient No date: Migraines No date: Neuropathy No date: Osteoarthritis No date: Osteoporosis No date: PONV (postoperative nausea and vomiting)     Comment:  hospitalized x 1 wk unable to keep anything down orally               1992 AFTER HYSTERECTOMY No date: Seizures (HCC)     Comment:  none in years No date: Umbilical hernia  Past Surgical History: No date: ABDOMINAL  HYSTERECTOMY     Comment:  w/oophorectomy 12/2020: basal cyst removed; Left     Comment:  cyst removed from LEft knee 1987: BREAST EXCISIONAL BIOPSY; Right     Comment:  neg No date: CARPAL TUNNEL RELEASE; Right 03/22/2021: CATARACT EXTRACTION W/PHACO; Left     Comment:  Procedure: CATARACT EXTRACTION PHACO AND INTRAOCULAR               LENS PLACEMENT (IOC) LEFT 8.57 00:55.7;  Surgeon:               Lockie Mola, MD;  Location: [facility];              Service: Ophthalmology;  Laterality: Left; 04/05/2021: CATARACT EXTRACTION W/PHACO; Right     Comment:  Procedure: CATARACT EXTRACTION PHACO AND INTRAOCULAR               LENS PLACEMENT (IOC) RIGHT 7.40 00:59.9;  Surgeon:               Lockie Mola, MD;  Location: [facility];              Service: Ophthalmology;  Laterality: Right; No date: COLONOSCOPY No date: dental extractions No date: HEMORRHOID SURGERY No date: KNEE ARTHROSCOPY; Left     Comment:  meniscus repair No date: PILONIDAL CYST / SINUS EXCISION 06/26/2018: SHOULDER ARTHROSCOPY WITH SUBACROMIAL DECOMPRESSION AND  BICEP TENDON REPAIR; Right     Comment:  Procedure: SHOULDER ARTHROSCOPY  WITH DEBRIDEMENT,               DECOMPRESSION, POSSIBLE ROTATOR CUFF REPAIR AND BICEP               TENODESIS-RIGHT;  Surgeon: Christena Flake, MD;  Location:               [facility];  Service: Orthopedics;  Laterality: Right; No date: TENDON REPAIR     Comment:  right hand No date: TONSILLECTOMY 03/02/2020: TRIGGER FINGER RELEASE; Right     Comment:  Procedure: RELEASE TRIGGER FINGER/A-1 PULLEY;  Surgeon:               Christena Flake, MD;  Location: [facility];  Service:               Orthopedics;  Laterality: Right;  BMI    Body Mass Index: 29.58 kg/m      Reproductive/Obstetrics negative OB ROS                             Anesthesia Physical Anesthesia Plan  ASA: 3  Anesthesia Plan: General ETT and General   Post-op  Pain Management:    Induction: Intravenous  PONV Risk Score and Plan: 4 or greater and Ondansetron, Dexamethasone, Midazolam and Propofol infusion  Airway Management Planned: Oral ETT  Additional Equipment:   Intra-op Plan:   Post-operative Plan: Extubation in OR  Informed Consent: I have reviewed the patients History and Physical, chart, labs and discussed the procedure including the risks, benefits and alternatives for the proposed anesthesia with the patient or authorized representative who has indicated his/her understanding and acceptance.     Dental Advisory Given  Plan Discussed with: Anesthesiologist, CRNA and Surgeon  Anesthesia Plan Comments: (Patient consented for risks of anesthesia including but not limited to:  - adverse reactions to medications - damage to eyes, teeth, lips or other oral mucosa - nerve damage due to positioning  - sore throat or hoarseness - Damage to heart, brain, nerves, lungs, other parts of body or loss of life  Patient voiced understanding and assent.)        Anesthesia Quick Evaluation

## 2023-03-28 ENCOUNTER — Encounter: Payer: Self-pay | Admitting: Surgery

## 2023-05-23 DIAGNOSIS — L570 Actinic keratosis: Secondary | ICD-10-CM | POA: Diagnosis not present

## 2023-05-23 DIAGNOSIS — D225 Melanocytic nevi of trunk: Secondary | ICD-10-CM | POA: Diagnosis not present

## 2023-05-23 DIAGNOSIS — D2271 Melanocytic nevi of right lower limb, including hip: Secondary | ICD-10-CM | POA: Diagnosis not present

## 2023-05-23 DIAGNOSIS — D2272 Melanocytic nevi of left lower limb, including hip: Secondary | ICD-10-CM | POA: Diagnosis not present

## 2023-05-23 DIAGNOSIS — D2261 Melanocytic nevi of right upper limb, including shoulder: Secondary | ICD-10-CM | POA: Diagnosis not present

## 2023-05-23 DIAGNOSIS — D2262 Melanocytic nevi of left upper limb, including shoulder: Secondary | ICD-10-CM | POA: Diagnosis not present

## 2023-05-23 DIAGNOSIS — L821 Other seborrheic keratosis: Secondary | ICD-10-CM | POA: Diagnosis not present

## 2023-05-23 DIAGNOSIS — Z85828 Personal history of other malignant neoplasm of skin: Secondary | ICD-10-CM | POA: Diagnosis not present

## 2023-08-28 ENCOUNTER — Other Ambulatory Visit: Payer: Self-pay | Admitting: Family Medicine

## 2023-08-28 DIAGNOSIS — Z1231 Encounter for screening mammogram for malignant neoplasm of breast: Secondary | ICD-10-CM

## 2023-09-09 DIAGNOSIS — E785 Hyperlipidemia, unspecified: Secondary | ICD-10-CM | POA: Diagnosis not present

## 2023-09-09 DIAGNOSIS — I10 Essential (primary) hypertension: Secondary | ICD-10-CM | POA: Diagnosis not present

## 2023-09-10 ENCOUNTER — Ambulatory Visit
Admission: RE | Admit: 2023-09-10 | Discharge: 2023-09-10 | Disposition: A | Discharge status: Home / Self Care | Source: Ambulatory Visit | Attending: Family Medicine | Admitting: Family Medicine

## 2023-09-10 DIAGNOSIS — Z1231 Encounter for screening mammogram for malignant neoplasm of breast: Secondary | ICD-10-CM | POA: Diagnosis not present

## 2023-09-17 DIAGNOSIS — Z1331 Encounter for screening for depression: Secondary | ICD-10-CM | POA: Diagnosis not present

## 2023-09-17 DIAGNOSIS — G609 Hereditary and idiopathic neuropathy, unspecified: Secondary | ICD-10-CM | POA: Diagnosis not present

## 2023-09-17 DIAGNOSIS — I10 Essential (primary) hypertension: Secondary | ICD-10-CM | POA: Diagnosis not present

## 2023-09-17 DIAGNOSIS — Z0000 Encounter for general adult medical examination without abnormal findings: Secondary | ICD-10-CM | POA: Diagnosis not present

## 2023-09-17 DIAGNOSIS — J309 Allergic rhinitis, unspecified: Secondary | ICD-10-CM | POA: Diagnosis not present

## 2023-11-11 DIAGNOSIS — Z0100 Encounter for examination of eyes and vision without abnormal findings: Secondary | ICD-10-CM | POA: Diagnosis not present

## 2023-11-11 DIAGNOSIS — Z961 Presence of intraocular lens: Secondary | ICD-10-CM | POA: Diagnosis not present

## 2023-11-11 DIAGNOSIS — H43813 Vitreous degeneration, bilateral: Secondary | ICD-10-CM | POA: Diagnosis not present

## 2023-11-11 DIAGNOSIS — H353131 Nonexudative age-related macular degeneration, bilateral, early dry stage: Secondary | ICD-10-CM | POA: Diagnosis not present

## 2023-11-11 DIAGNOSIS — H35372 Puckering of macula, left eye: Secondary | ICD-10-CM | POA: Diagnosis not present
